# Patient Record
Sex: Female | Born: 1955 | Race: White | Hispanic: No | Smoking: Current every day smoker
Health system: Southern US, Community
[De-identification: ages and names within clinical notes are randomized; demographics above are authoritative.]

## PROBLEM LIST (undated history)

## (undated) DIAGNOSIS — J449 Chronic obstructive pulmonary disease, unspecified: Secondary | ICD-10-CM

## (undated) DIAGNOSIS — F32A Depression, unspecified: Secondary | ICD-10-CM

## (undated) DIAGNOSIS — F329 Major depressive disorder, single episode, unspecified: Secondary | ICD-10-CM

## (undated) DIAGNOSIS — F419 Anxiety disorder, unspecified: Secondary | ICD-10-CM

## (undated) DIAGNOSIS — E876 Hypokalemia: Secondary | ICD-10-CM

## (undated) DIAGNOSIS — I10 Essential (primary) hypertension: Secondary | ICD-10-CM

## (undated) DIAGNOSIS — C801 Malignant (primary) neoplasm, unspecified: Secondary | ICD-10-CM

## (undated) HISTORY — PX: BLADDER SURGERY: SHX569

## (undated) HISTORY — PX: ABDOMINAL HYSTERECTOMY: SHX81

## (undated) HISTORY — PX: CRANIOTOMY: SHX93

## (undated) HISTORY — PX: BREAST LUMPECTOMY: SHX2

---

## 1998-10-10 ENCOUNTER — Other Ambulatory Visit: Admission: RE | Admit: 1998-10-10 | Discharge: 1998-10-10 | Payer: Self-pay | Admitting: Family Medicine

## 2000-05-13 ENCOUNTER — Encounter: Payer: Self-pay | Admitting: Family Medicine

## 2000-05-13 ENCOUNTER — Encounter: Admission: RE | Admit: 2000-05-13 | Discharge: 2000-05-13 | Payer: Self-pay | Admitting: Family Medicine

## 2000-07-18 ENCOUNTER — Other Ambulatory Visit: Admission: RE | Admit: 2000-07-18 | Discharge: 2000-07-18 | Payer: Self-pay | Admitting: Family Medicine

## 2004-03-02 ENCOUNTER — Ambulatory Visit: Payer: Self-pay | Admitting: Oncology

## 2004-03-07 ENCOUNTER — Other Ambulatory Visit: Payer: Self-pay

## 2004-03-27 ENCOUNTER — Ambulatory Visit: Payer: Self-pay | Admitting: Oncology

## 2004-04-26 ENCOUNTER — Ambulatory Visit: Payer: Self-pay | Admitting: Oncology

## 2004-05-27 ENCOUNTER — Ambulatory Visit: Payer: Self-pay | Admitting: Oncology

## 2004-06-27 ENCOUNTER — Ambulatory Visit: Payer: Self-pay | Admitting: Oncology

## 2004-07-25 ENCOUNTER — Ambulatory Visit: Payer: Self-pay | Admitting: Oncology

## 2004-08-25 ENCOUNTER — Ambulatory Visit: Payer: Self-pay | Admitting: Oncology

## 2004-09-24 ENCOUNTER — Ambulatory Visit: Payer: Self-pay | Admitting: Oncology

## 2004-10-10 ENCOUNTER — Encounter: Payer: Self-pay | Admitting: Oncology

## 2004-10-25 ENCOUNTER — Encounter: Payer: Self-pay | Admitting: Oncology

## 2004-10-25 ENCOUNTER — Ambulatory Visit: Payer: Self-pay | Admitting: Oncology

## 2004-11-24 ENCOUNTER — Ambulatory Visit: Payer: Self-pay | Admitting: Oncology

## 2004-11-24 ENCOUNTER — Encounter: Payer: Self-pay | Admitting: Oncology

## 2004-12-25 ENCOUNTER — Encounter: Payer: Self-pay | Admitting: Oncology

## 2005-01-01 ENCOUNTER — Ambulatory Visit: Payer: Self-pay | Admitting: General Surgery

## 2005-01-17 ENCOUNTER — Ambulatory Visit: Payer: Self-pay | Admitting: Radiation Oncology

## 2005-01-25 ENCOUNTER — Encounter: Payer: Self-pay | Admitting: Oncology

## 2005-01-31 ENCOUNTER — Ambulatory Visit: Payer: Self-pay | Admitting: Oncology

## 2005-02-05 ENCOUNTER — Ambulatory Visit: Payer: Self-pay | Admitting: General Surgery

## 2005-02-06 ENCOUNTER — Ambulatory Visit: Payer: Self-pay | Admitting: Family Medicine

## 2005-02-08 ENCOUNTER — Ambulatory Visit: Payer: Self-pay | Admitting: General Surgery

## 2005-02-24 ENCOUNTER — Ambulatory Visit: Payer: Self-pay | Admitting: Oncology

## 2005-06-05 ENCOUNTER — Ambulatory Visit: Payer: Self-pay | Admitting: Oncology

## 2005-06-27 ENCOUNTER — Ambulatory Visit: Payer: Self-pay | Admitting: Oncology

## 2005-07-31 ENCOUNTER — Ambulatory Visit: Payer: Self-pay | Admitting: General Surgery

## 2005-07-31 ENCOUNTER — Ambulatory Visit: Payer: Self-pay | Admitting: Oncology

## 2005-09-02 ENCOUNTER — Ambulatory Visit: Payer: Self-pay | Admitting: Oncology

## 2005-09-24 ENCOUNTER — Ambulatory Visit: Payer: Self-pay | Admitting: Oncology

## 2005-11-22 ENCOUNTER — Emergency Department: Payer: Self-pay | Admitting: Emergency Medicine

## 2006-02-03 ENCOUNTER — Ambulatory Visit: Payer: Self-pay | Admitting: General Surgery

## 2006-02-19 ENCOUNTER — Ambulatory Visit: Payer: Self-pay | Admitting: Oncology

## 2006-02-24 ENCOUNTER — Ambulatory Visit: Payer: Self-pay | Admitting: Oncology

## 2006-03-27 ENCOUNTER — Ambulatory Visit: Payer: Self-pay | Admitting: Oncology

## 2006-05-30 ENCOUNTER — Inpatient Hospital Stay: Payer: Self-pay | Admitting: Cardiology

## 2006-05-30 ENCOUNTER — Other Ambulatory Visit: Payer: Self-pay

## 2006-06-19 ENCOUNTER — Ambulatory Visit: Payer: Self-pay | Admitting: Oncology

## 2006-06-26 ENCOUNTER — Ambulatory Visit: Payer: Self-pay | Admitting: Gastroenterology

## 2006-06-27 ENCOUNTER — Ambulatory Visit: Payer: Self-pay | Admitting: Oncology

## 2006-07-05 ENCOUNTER — Emergency Department: Payer: Self-pay | Admitting: General Practice

## 2006-08-04 ENCOUNTER — Ambulatory Visit: Payer: Self-pay | Admitting: Oncology

## 2006-08-26 ENCOUNTER — Ambulatory Visit: Payer: Self-pay | Admitting: Oncology

## 2006-09-25 ENCOUNTER — Ambulatory Visit: Payer: Self-pay | Admitting: Internal Medicine

## 2006-10-26 ENCOUNTER — Ambulatory Visit: Payer: Self-pay | Admitting: Oncology

## 2006-10-29 ENCOUNTER — Ambulatory Visit: Payer: Self-pay | Admitting: Oncology

## 2006-11-10 ENCOUNTER — Ambulatory Visit: Payer: Self-pay | Admitting: Obstetrics and Gynecology

## 2006-11-17 ENCOUNTER — Emergency Department: Payer: Self-pay | Admitting: General Practice

## 2006-11-25 ENCOUNTER — Ambulatory Visit: Payer: Self-pay | Admitting: Oncology

## 2007-01-26 ENCOUNTER — Ambulatory Visit: Payer: Self-pay | Admitting: Radiation Oncology

## 2007-02-04 ENCOUNTER — Ambulatory Visit: Payer: Self-pay | Admitting: Oncology

## 2007-02-13 ENCOUNTER — Ambulatory Visit: Payer: Self-pay | Admitting: Internal Medicine

## 2007-02-25 ENCOUNTER — Ambulatory Visit: Payer: Self-pay | Admitting: Oncology

## 2007-02-25 ENCOUNTER — Ambulatory Visit: Payer: Self-pay | Admitting: Radiation Oncology

## 2007-04-27 ENCOUNTER — Ambulatory Visit: Payer: Self-pay | Admitting: Oncology

## 2007-05-14 ENCOUNTER — Ambulatory Visit: Payer: Self-pay | Admitting: Oncology

## 2007-05-28 ENCOUNTER — Ambulatory Visit: Payer: Self-pay | Admitting: Oncology

## 2007-06-02 ENCOUNTER — Other Ambulatory Visit: Payer: Self-pay

## 2007-06-02 ENCOUNTER — Emergency Department: Payer: Self-pay | Admitting: Emergency Medicine

## 2007-08-19 ENCOUNTER — Ambulatory Visit: Payer: Self-pay | Admitting: Oncology

## 2008-01-08 IMAGING — CR DG TIBIA/FIBULA 2V*L*
1 series · 2 of 2 positions shown · non-contrast
Comparison: none

REASON FOR EXAM: Injury  - mc rm 1
COMMENTS:

PROCEDURE:     DXR - DXR TIBIA AND FIBULA LT (LOWER L  - November 22, 2005  [DATE]
RESULT:     AP and lateral views of the tibia and fibula reveal the bones to
be adequately mineralized for age.  I do not see evidence of an acute
fracture.  The overlying soft tissues are grossly normal.

[Series 1: view not recorded · 0.17mm/px · 2 of 2 slices shown]
[im 1/2]
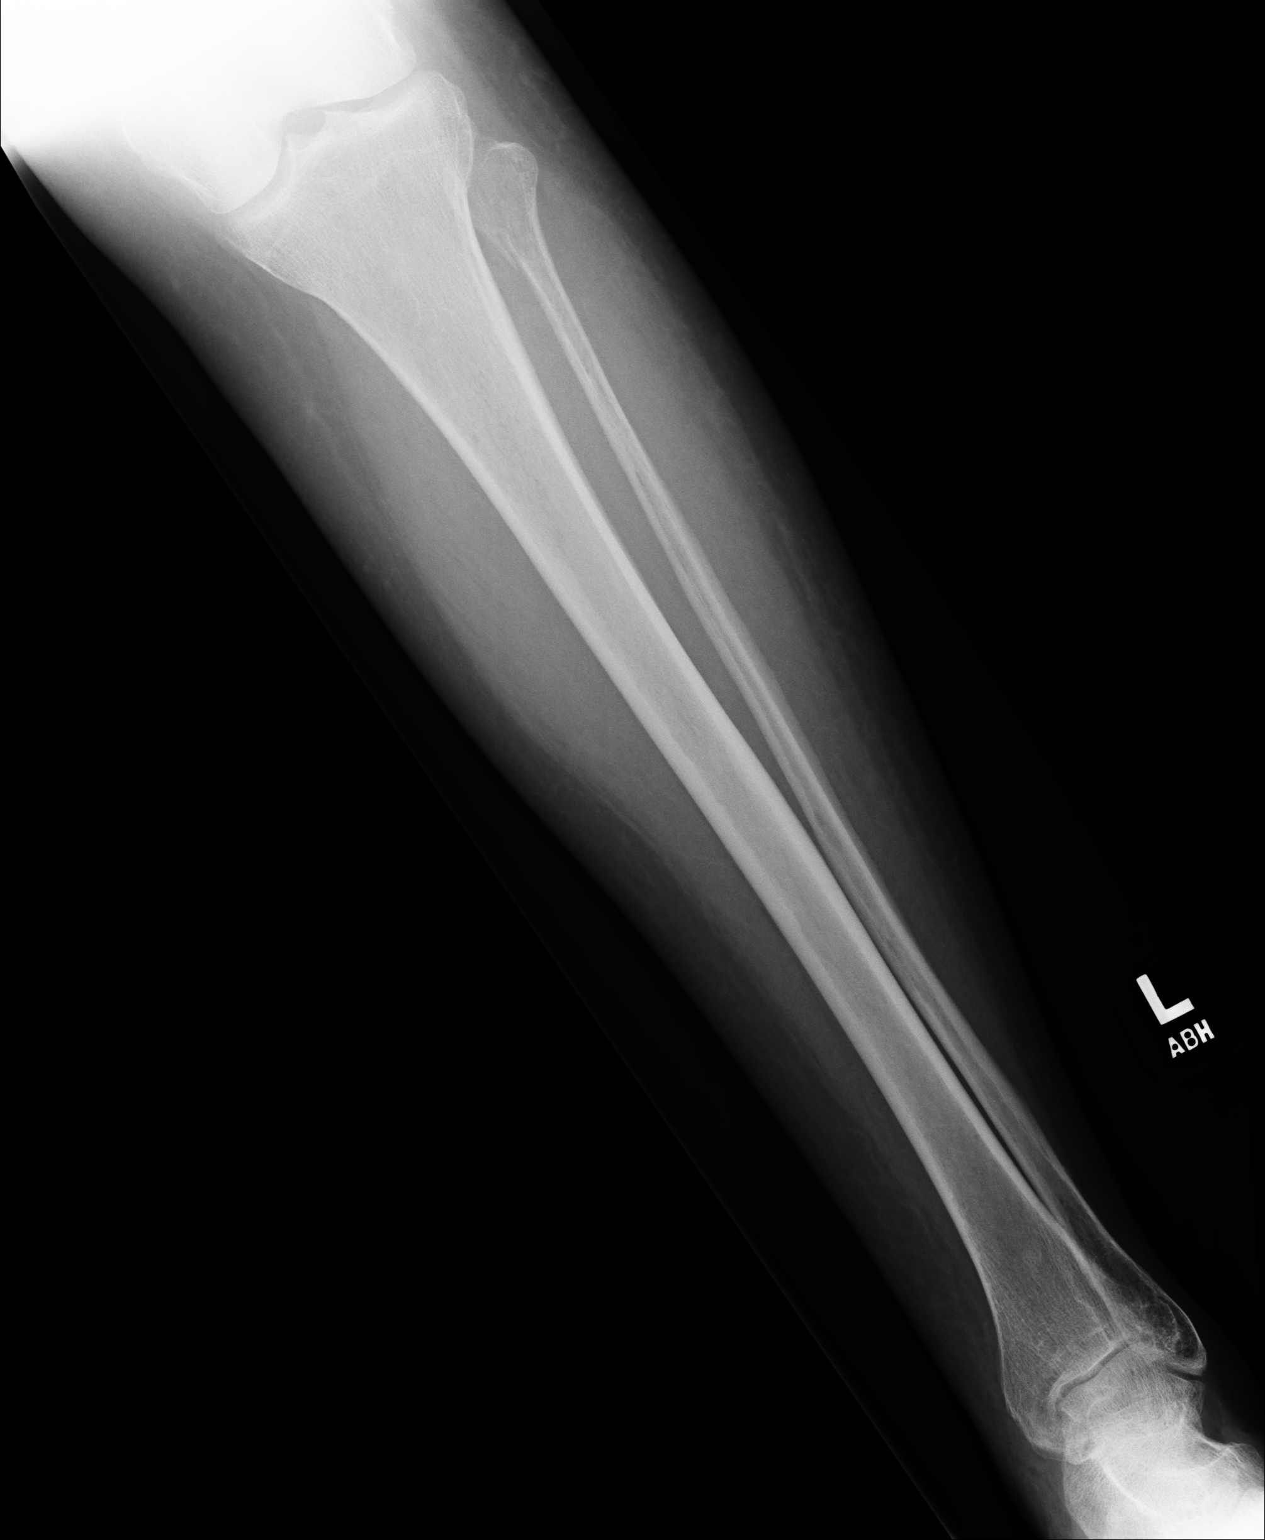
[im 2/2]
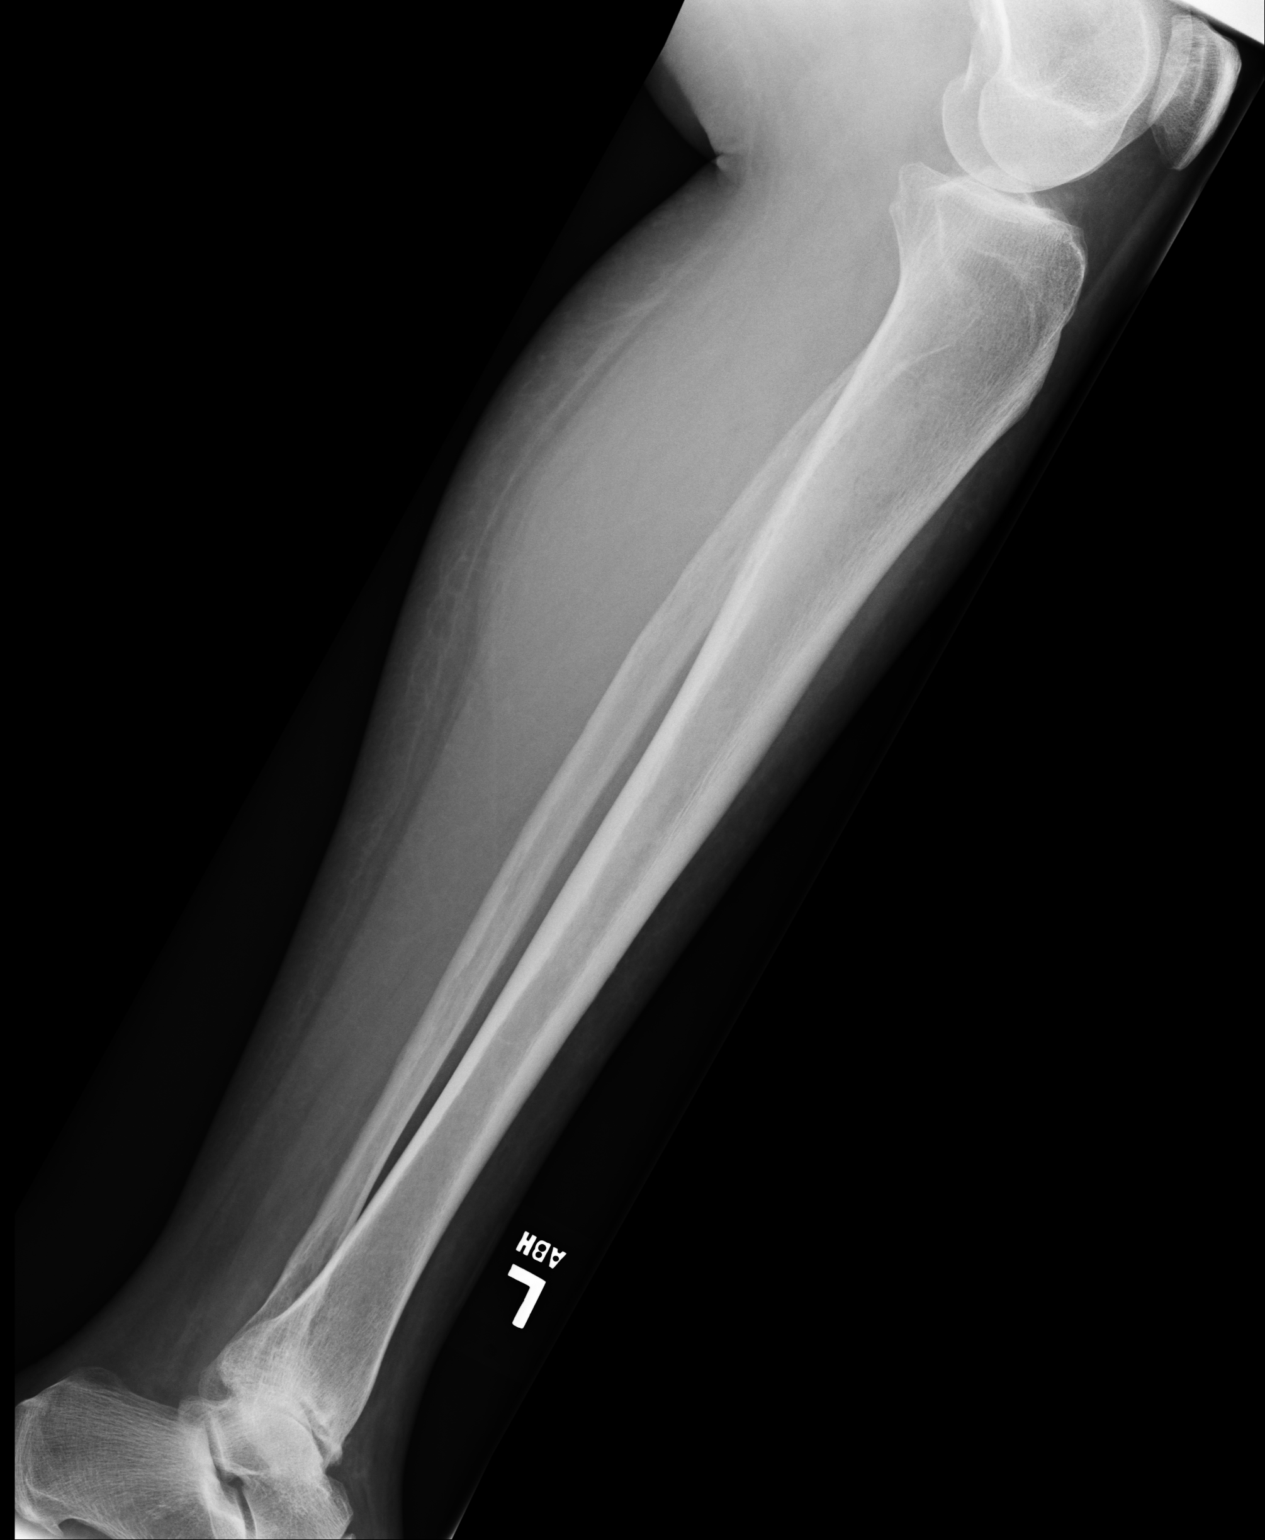

[2 of 2 positions shown; findings below may reference images not displayed]

IMPRESSION: I see no acute abnormality of the LEFT tibia or fibula.

## 2008-01-26 ENCOUNTER — Ambulatory Visit: Payer: Self-pay | Admitting: Oncology

## 2008-12-14 IMAGING — US US EXTREM LOW VENOUS BILAT
1 series · 17 of 24 positions shown · non-contrast
Comparison: none

REASON FOR EXAM: Bilateral pain and swelling
                       Call report to: [phone]
COMMENTS:

[Series 1: us extrem low venous bilat · 17 of 52 slices shown]
[im 1/52]
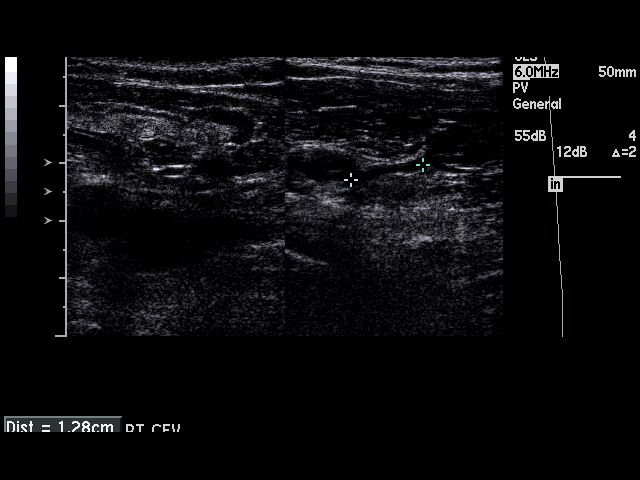
[im 5/52]
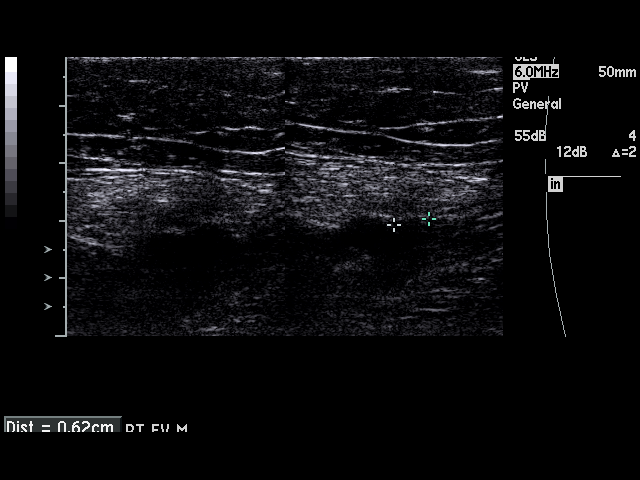
[im 7/52]
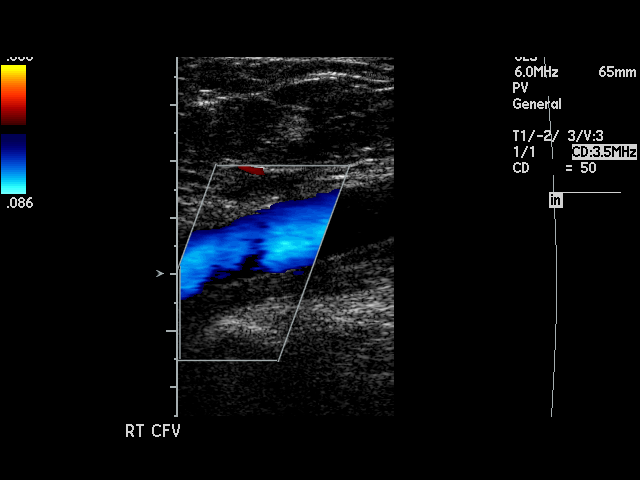
[im 9/52]
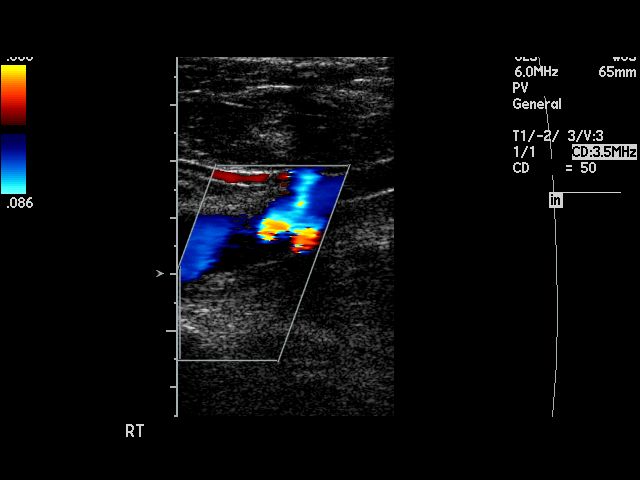
[im 14/52]
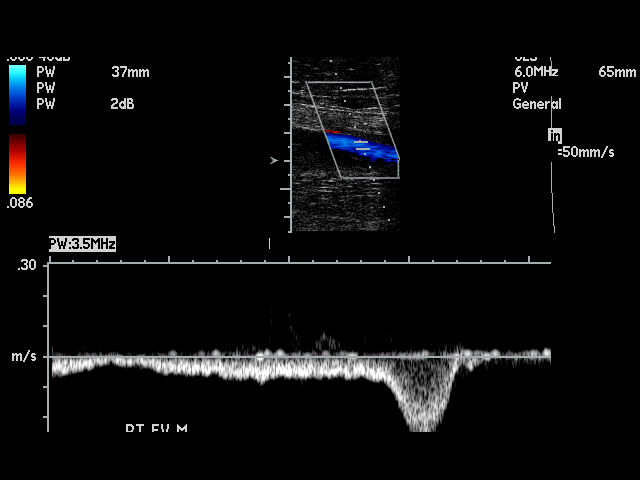
[im 16/52]
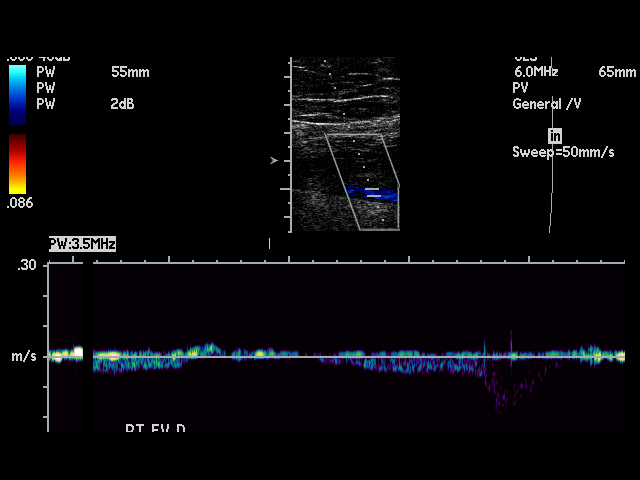
[im 20/52]
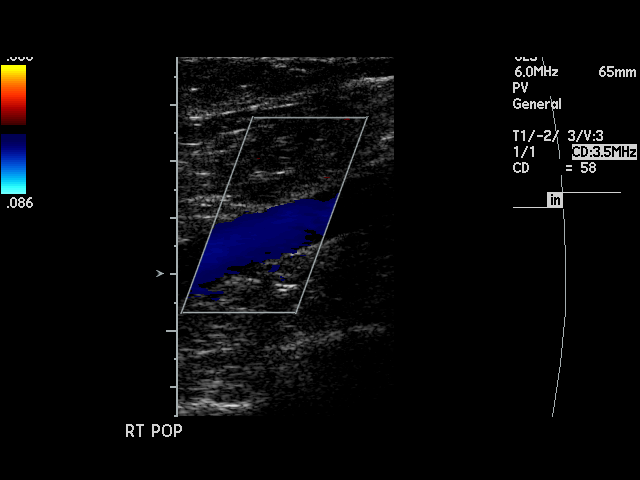
[im 23/52]
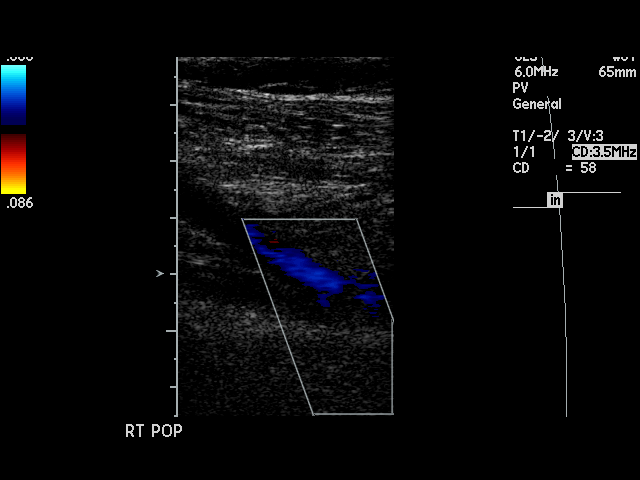
[im 27/52]
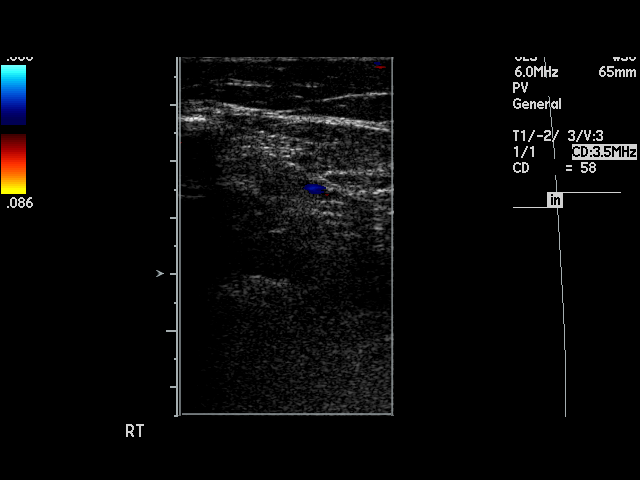
[im 29/52]
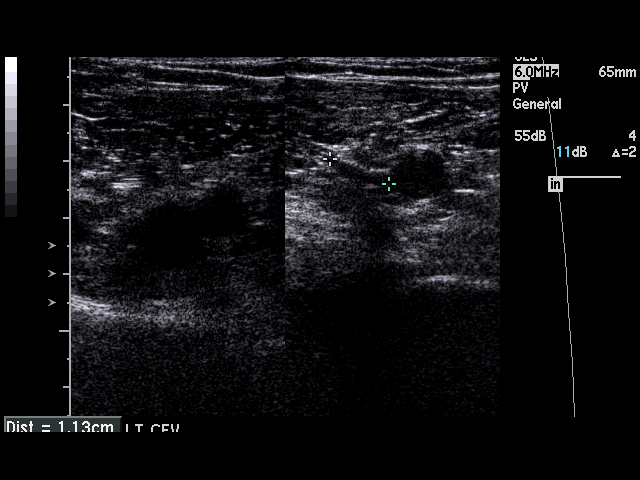
[im 32/52]
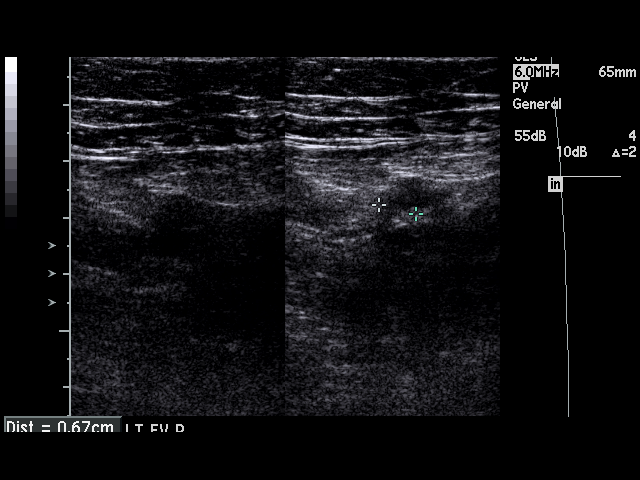
[im 36/52]
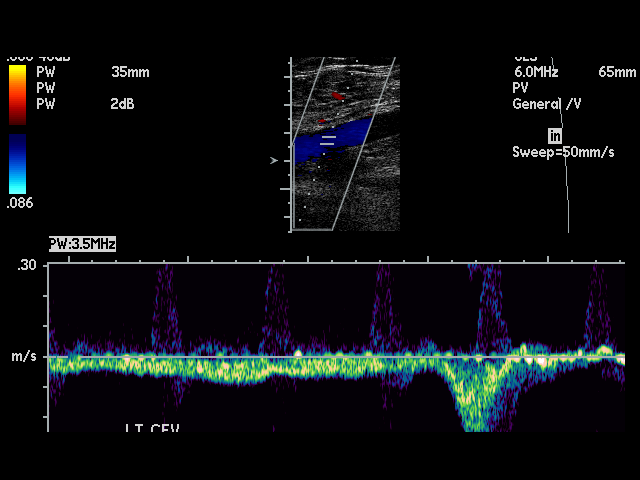
[im 38/52]
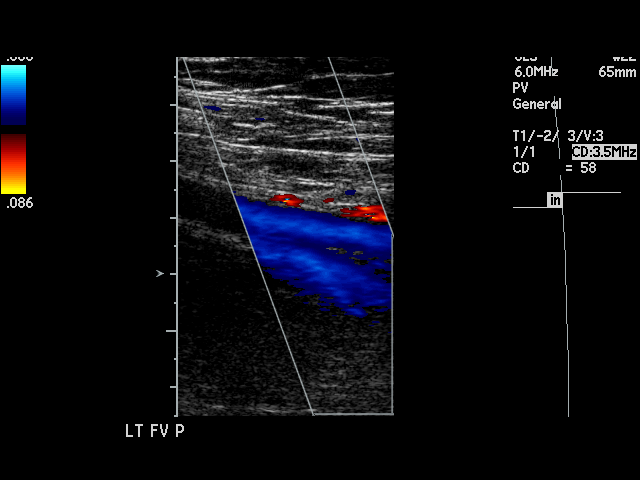
[im 43/52]
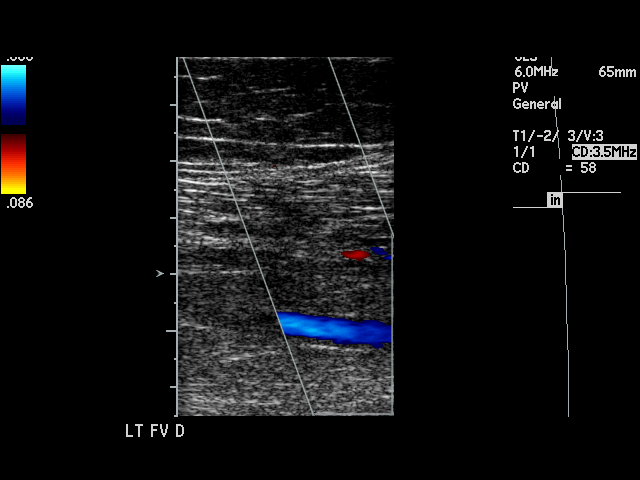
[im 45/52]
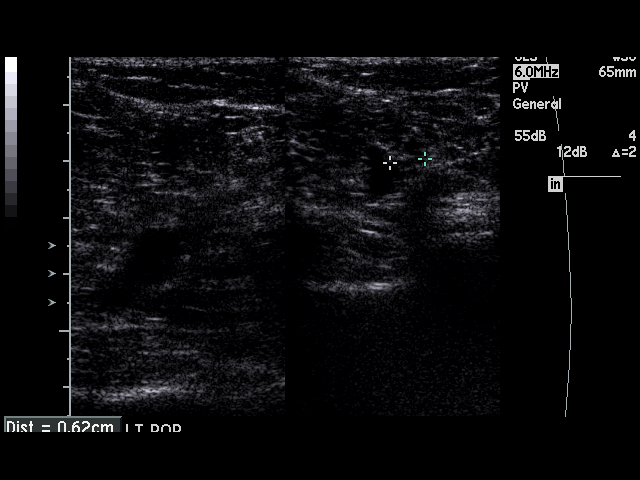
[im 47/52]
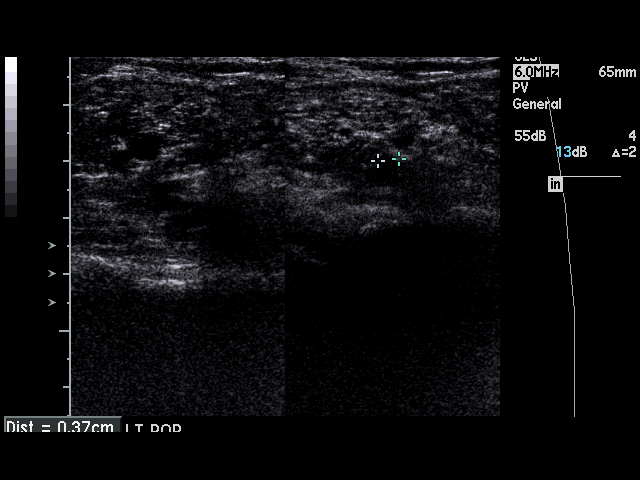
[im 52/52]
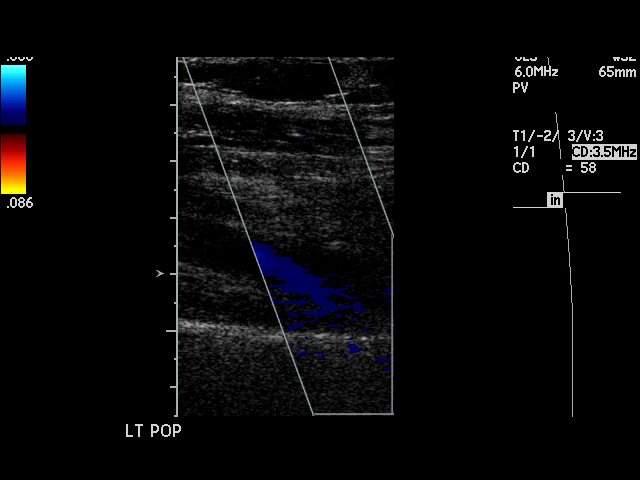

[17 of 24 positions shown; findings below may reference images not displayed]

PROCEDURE:     US  - US DOPPLER LOW EXTR BILATERAL  - October 29, 2006  [DATE]

RESULT:     The phasic and augmentation flow waveforms are normal
bilaterally. There is observed normal compressibility of the femoral and
popliteal veins bilaterally. Doppler examination shows no deep vein
thrombosis or venous occlusion on either side.
IMPRESSION: Bilaterally normal study. No deep venous thrombosis is
identified on either side.

## 2009-06-29 IMAGING — CR DG CHEST 2V
1 series · 2 of 2 positions shown · non-contrast
Comparison: none

REASON FOR EXAM: Cough
COMMENTS:

[Series 1: view not recorded · 0.17mm/px · 2 of 2 slices shown]
[im 1/2]
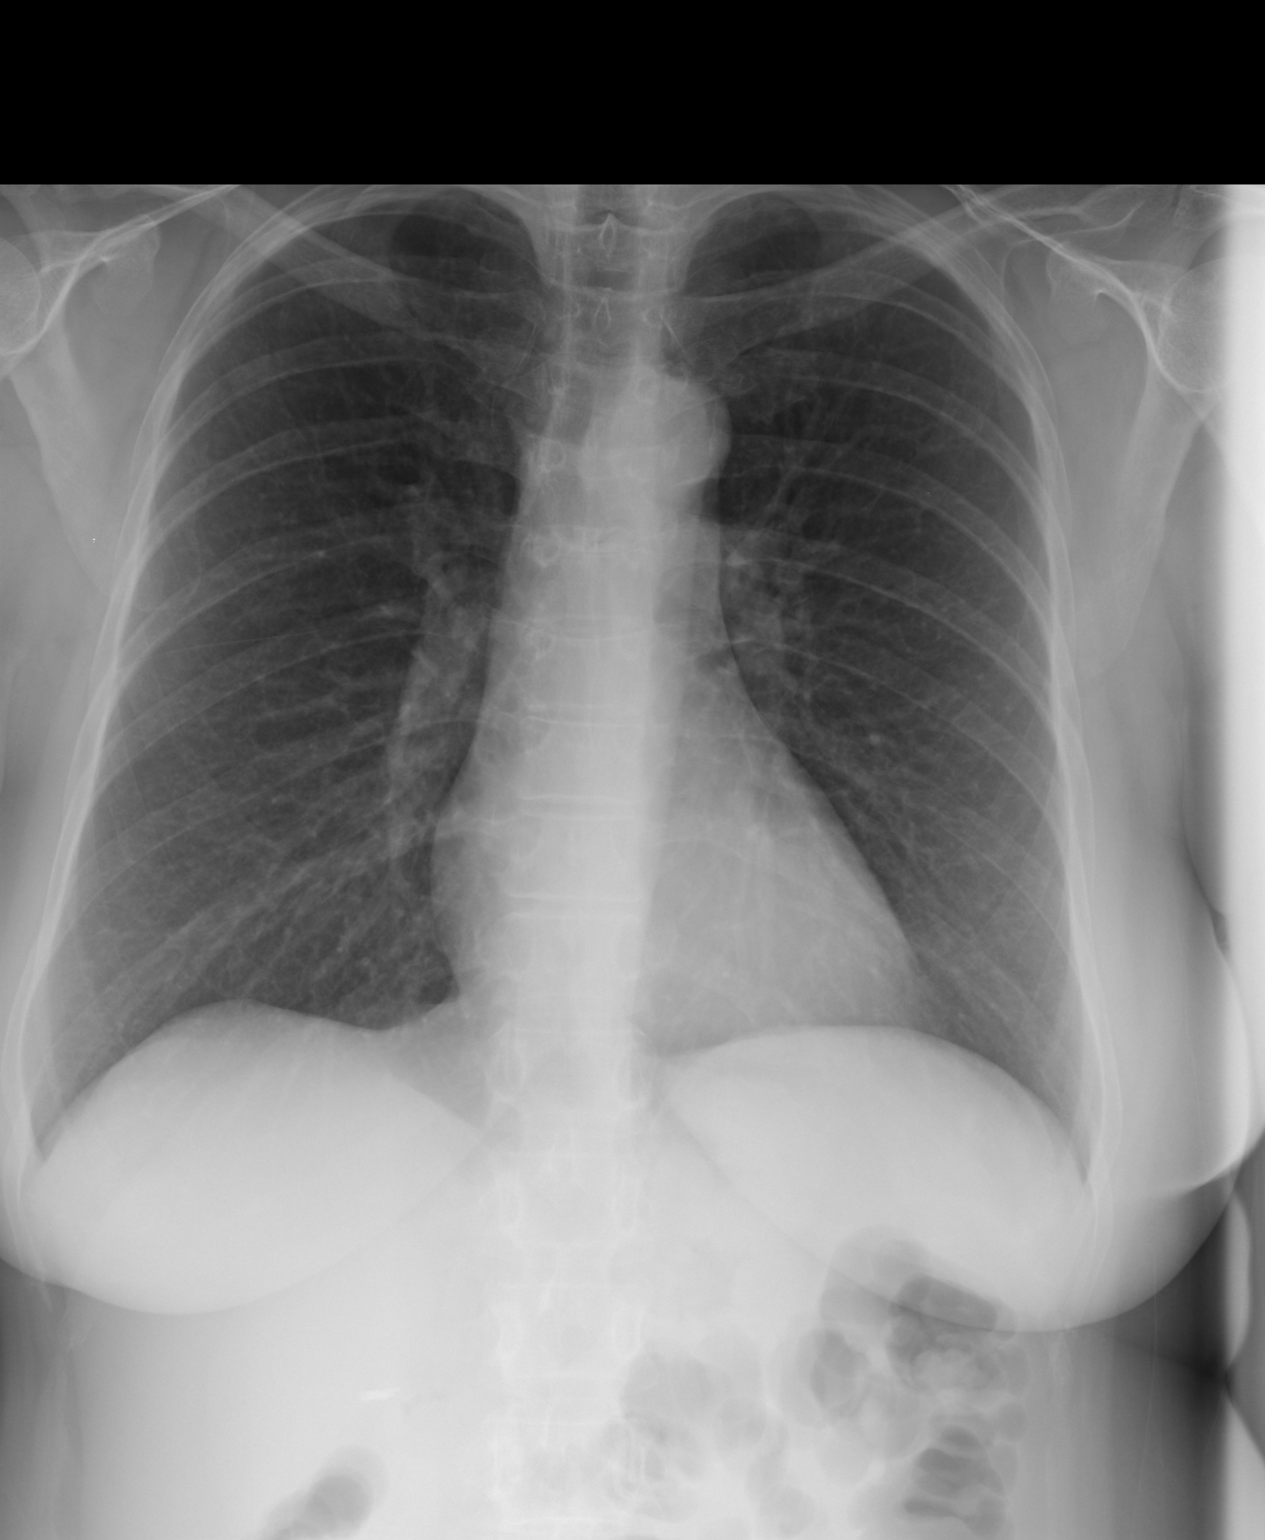
[im 2/2]
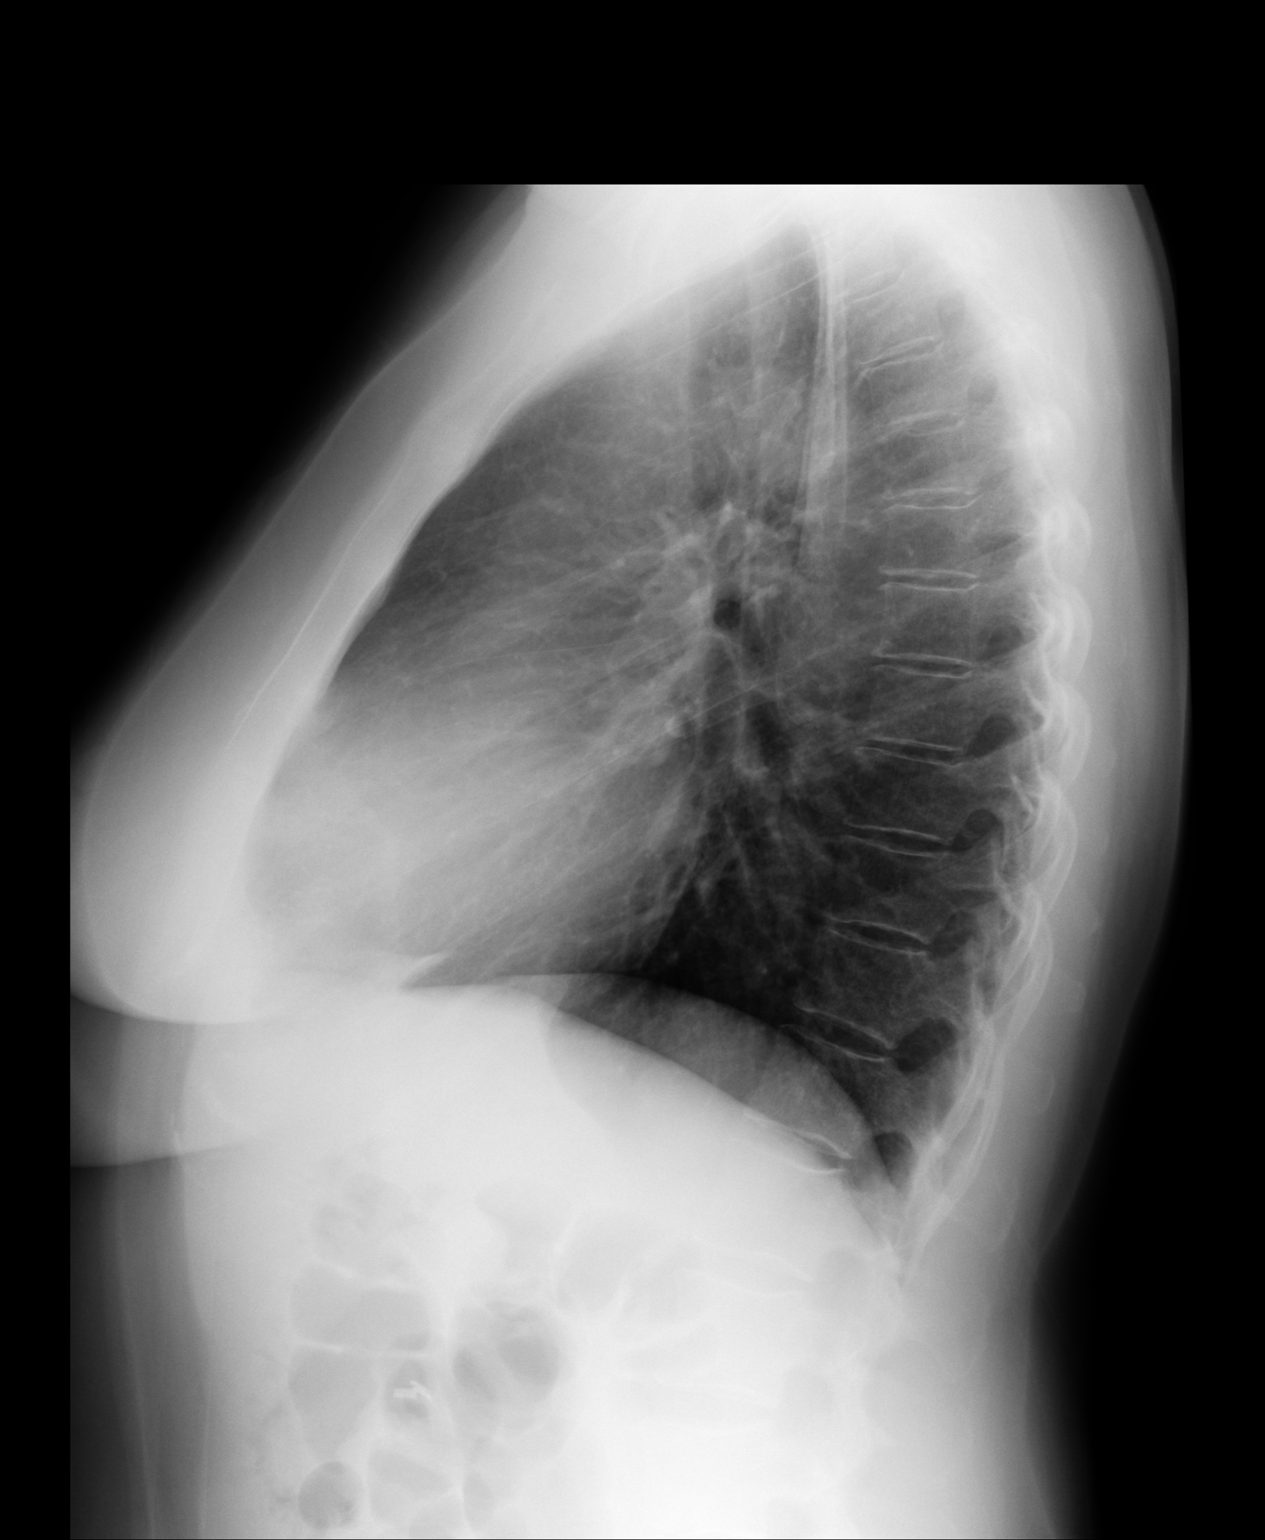

[2 of 2 positions shown; findings below may reference images not displayed]

PROCEDURE:     DXR - DXR CHEST PA (OR AP) AND LATERAL  - May 14, 2007  [DATE]

RESULT:     Comparison is made to a prior exam of 11/17/2006.

The lung fields are clear. No pneumonia, pneumothorax or pleural effusion is
seen. No findings suspicious for metastatic disease are identified. The
heart size is normal. No significant osseous abnormalities are noted.
IMPRESSION: No acute changes are identified.

## 2014-10-07 ENCOUNTER — Telehealth: Payer: Self-pay

## 2014-10-10 NOTE — Telephone Encounter (Signed)
I left an additional message at [phone] for patient to complete follow up for patient enrollment in NSABP B-36 research study. I contacted [facility] in [place] as patient noted her primary care doctor was Dr. Hortencia Pilar.  Patient was seen at primary care office on Sep 26, 2014.  [facility] to fax me office note.  Once note received, I will use this documentation to confirm survival status of patient.

## 2016-05-29 ENCOUNTER — Encounter: Payer: Self-pay | Admitting: Emergency Medicine

## 2016-05-29 ENCOUNTER — Emergency Department: Payer: Medicare PPO

## 2016-05-29 ENCOUNTER — Emergency Department
Admission: EM | Admit: 2016-05-29 | Discharge: 2016-05-30 | Disposition: A | Discharge status: Home / Self Care | Payer: Medicare PPO | Attending: Emergency Medicine | Admitting: Emergency Medicine

## 2016-05-29 DIAGNOSIS — I10 Essential (primary) hypertension: Secondary | ICD-10-CM | POA: Diagnosis not present

## 2016-05-29 DIAGNOSIS — Z79899 Other long term (current) drug therapy: Secondary | ICD-10-CM | POA: Insufficient documentation

## 2016-05-29 DIAGNOSIS — Z853 Personal history of malignant neoplasm of breast: Secondary | ICD-10-CM | POA: Diagnosis not present

## 2016-05-29 DIAGNOSIS — Z87891 Personal history of nicotine dependence: Secondary | ICD-10-CM | POA: Insufficient documentation

## 2016-05-29 DIAGNOSIS — R52 Pain, unspecified: Secondary | ICD-10-CM | POA: Diagnosis present

## 2016-05-29 DIAGNOSIS — J441 Chronic obstructive pulmonary disease with (acute) exacerbation: Secondary | ICD-10-CM | POA: Diagnosis not present

## 2016-05-29 DIAGNOSIS — Z7982 Long term (current) use of aspirin: Secondary | ICD-10-CM | POA: Insufficient documentation

## 2016-05-29 HISTORY — DX: Malignant (primary) neoplasm, unspecified: C80.1

## 2016-05-29 HISTORY — DX: Anxiety disorder, unspecified: F41.9

## 2016-05-29 HISTORY — DX: Essential (primary) hypertension: I10

## 2016-05-29 HISTORY — DX: Chronic obstructive pulmonary disease, unspecified: J44.9

## 2016-05-29 HISTORY — DX: Hypokalemia: E87.6

## 2016-05-29 HISTORY — DX: Depression, unspecified: F32.A

## 2016-05-29 HISTORY — DX: Major depressive disorder, single episode, unspecified: F32.9

## 2016-05-29 LAB — BASIC METABOLIC PANEL
Anion gap: 7 (ref 5–15)
BUN: 16 mg/dL (ref 6–20)
CHLORIDE: 104 mmol/L (ref 101–111)
CO2: 28 mmol/L (ref 22–32)
Calcium: 7.9 mg/dL — ABNORMAL LOW (ref 8.9–10.3)
Creatinine, Ser: 0.89 mg/dL (ref 0.44–1.00)
GFR calc non Af Amer: >60 mL/min (ref >60)
Glucose, Bld: 97 mg/dL (ref 65–99)
POTASSIUM: 3.7 mmol/L (ref 3.5–5.1)
Sodium: 139 mmol/L (ref 135–145)

## 2016-05-29 LAB — TROPONIN I: Troponin I: <0.03 ng/mL (ref <0.03)

## 2016-05-29 LAB — CBC
HEMATOCRIT: 37.5 % (ref 35.0–47.0)
HEMOGLOBIN: 12.8 g/dL (ref 12.0–16.0)
MCH: 30.4 pg (ref 26.0–34.0)
MCHC: 34.1 g/dL (ref 32.0–36.0)
MCV: 89.4 fL (ref 80.0–100.0)
Platelets: 169 10*3/uL (ref 150–440)
RBC: 4.2 MIL/uL (ref 3.80–5.20)
RDW: 14.5 % (ref 11.5–14.5)
WBC: 9.8 10*3/uL (ref 3.6–11.0)

## 2016-05-29 LAB — MAGNESIUM: Magnesium: 1 mg/dL — ABNORMAL LOW (ref 1.7–2.4)

## 2016-05-29 MED ORDER — LEVALBUTEROL HCL 1.25 MG/0.5ML IN NEBU
1.2500 mg | INHALATION_SOLUTION | Freq: Once | RESPIRATORY_TRACT | Status: AC
  Administered 2016-05-30: 1.25 mg via RESPIRATORY_TRACT
  Filled 2016-05-29: qty 0.5

## 2016-05-29 MED ORDER — MAGNESIUM SULFATE 2 GM/50ML IV SOLN
2.0000 g | Freq: Once | INTRAVENOUS | Status: AC
  Administered 2016-05-29: 2 g via INTRAVENOUS
  Filled 2016-05-29: qty 50

## 2016-05-29 MED ORDER — METHYLPREDNISOLONE SODIUM SUCC 125 MG IJ SOLR
125.0000 mg | Freq: Once | INTRAMUSCULAR | Status: AC
  Administered 2016-05-29: 125 mg via INTRAVENOUS
  Filled 2016-05-29: qty 2

## 2016-05-29 NOTE — ED Triage Notes (Signed)
Pt to triage via [place] in [place], report generalized body aches, SOB, seen by PCP yesterday and told magnesium and potassium were abnormal.  Pt NAD at this time, resp equal and unlabored, skin warm and dry.

## 2016-05-29 NOTE — ED Provider Notes (Signed)
[facility] Emergency Department Provider Note   ____________________________________________   First MD Initiated Contact with Patient 05/29/16 2323     (approximate)  I have reviewed the triage vital signs and the nursing notes.   HISTORY  Chief Complaint Generalized Body Aches; Shortness of Breath; and Abnormal Lab  HPI Jill Harrison is a 61 y.o. female who comes into the hospital today with some body aches. The patient reports that she woke up on Sunday and thought that she might have the flu. The patient reports that she went to see her doctor and she was told that her flu test was negative. The patient reports that her symptoms seem to be worsening. She reports that she's burning in her hands as well as having some pain in her legs or arms her neck her upper chest and her back. She reports that she hasn't taken anything at home for pain but rates her pain a 7 out of 10 in intensity. She reports that she came in just because everything was getting worse. The patient has had problems with low potassium in the past and was told her magnesium was low 2 weeks ago. The patient was unsure if that had anything to do with the symptoms. The patient states that she just didn't feel right so she decided to come in to get checked out. The patient has had some generalized weakness and has been staying in bed a lot. The patient did have some fevers on Sunday and Monday she reports with a MAXIMUM TEMPERATURE of 101. The patient has had some cough with shortness of breath and sore throat. The patient is here today for evaluation.   Past Medical History:  Diagnosis Date  . Anxiety   . Cancer [facility])    left breast cancer  . COPD (chronic obstructive pulmonary disease) ([place])   . Depression   . Hypertension   . Hypokalemia     There are no active problems to display for this patient.   Past Surgical History:  Procedure Laterality Date  . ABDOMINAL HYSTERECTOMY      . BLADDER SURGERY    . BREAST LUMPECTOMY    . CRANIOTOMY      Prior to Admission medications   Medication Sig Start Date End Date Taking? Authorizing Provider  ADVAIR DISKUS 500-50 MCG/DOSE AEPB Inhale 1 puff into the lungs 2 (two) times daily.   Yes Historical Provider, MD  ALPRAZolam Duanne Moron) 0.5 MG tablet Take 0.5 mg by mouth 2 (two) times daily as needed for anxiety.   Yes Historical Provider, MD  aspirin EC 81 MG tablet Take 81 mg by mouth daily.   Yes Historical Provider, MD  atorvastatin (LIPITOR) 20 MG tablet Take 20 mg by mouth every evening.   Yes Historical Provider, MD  benazepril (LOTENSIN) 40 MG tablet Take 40 mg by mouth daily.   Yes Historical Provider, MD  FLUoxetine (PROZAC) 40 MG capsule Take 40 mg by mouth daily.   Yes Historical Provider, MD  hydrochlorothiazide (HYDRODIURIL) 12.5 MG tablet Take 12.5 mg by mouth daily.   Yes Historical Provider, MD  letrozole (FEMARA) 2.5 MG tablet Take 2.5 mg by mouth daily.   Yes Historical Provider, MD  levalbuterol [facility] HFA) 45 MCG/ACT inhaler Inhale 2 puffs into the lungs every 4 (four) hours as needed for wheezing.   Yes Historical Provider, MD  pantoprazole (PROTONIX) 40 MG tablet Take 40 mg by mouth 2 (two) times daily.    Yes Historical Provider,  MD  potassium chloride SA (K-DUR,KLOR-CON) 20 MEQ tablet Take 40 mEq by mouth daily.   Yes Historical Provider, MD  predniSONE (DELTASONE) 10 MG tablet Take 10 mg by mouth as directed. 3 tabs 2x daily days 1, 2, 3; 2 tabs 2x daily days 4, 5, 6; 1 tab 2x daily days 7, 8, 9; half tab 2x daily days 10, 11 and 12 05/17/16  Yes Historical Provider, MD  spironolactone (ALDACTONE) 25 MG tablet Take 25 mg by mouth daily.   Yes Historical Provider, MD  tiotropium (SPIRIVA) 18 MCG inhalation capsule Place 18 mcg into inhaler and inhale daily.   Yes Historical Provider, MD  etodolac (LODINE) 200 MG capsule Take 1 capsule (200 mg total) by mouth every 8 (eight) hours. 05/30/16   Loney Hering,  MD  predniSONE (DELTASONE) 20 MG tablet Take 3 tablets (60 mg total) by mouth daily. 05/30/16   Loney Hering, MD    Allergies Albuterol and Avelox [moxifloxacin hcl in nacl]  History reviewed. No pertinent family history.  Social History Social History  Substance Use Topics  . Smoking status: Former Research scientist (life sciences)  . Smokeless tobacco: Never Used  . Alcohol use No    Review of Systems Constitutional:  fever/chills Eyes: No visual changes. ENT: No sore throat. Cardiovascular: Denies chest pain. Respiratory: cough and shortness of breath. Gastrointestinal: No abdominal pain.  No nausea, no vomiting.  No diarrhea.  No constipation. Genitourinary: Negative for dysuria. Musculoskeletal: body aches Skin: Negative for rash. Neurological: Generalized weakness  10-point ROS otherwise negative.  ____________________________________________   PHYSICAL EXAM:  VITAL SIGNS: ED Triage Vitals  Enc Vitals Group     BP 05/29/16 2017 (!) 146/104     Pulse Rate 05/29/16 2017 87     Resp 05/29/16 2017 20     Temp 05/29/16 2017 98.2 F (36.8 C)     Temp Source 05/29/16 2017 Oral     SpO2 05/29/16 2017 96 %     Weight 05/29/16 2018 180 lb (81.6 kg)     Height 05/29/16 2018 5\' 7"  (1.702 m)     Head Circumference --      Peak Flow --      Pain Score 05/29/16 2018 7     Pain Loc --      Pain Edu? --      Excl. in [place]? --     Constitutional: Alert and oriented. Well appearing and in moderate distress. Eyes: Conjunctivae are normal. PERRL. EOMI. Head: Atraumatic. Nose: No congestion/rhinnorhea. Mouth/Throat: Mucous membranes are moist.  Oropharynx non-erythematous. Cardiovascular: Normal rate, regular rhythm. Grossly normal heart sounds.  Good peripheral circulation. Respiratory: Normal respiratory effort.  No retractions. Wheezing in all lung fields Gastrointestinal: Soft and nontender. No distention. Positive bowel sounds Musculoskeletal: No lower extremity tenderness nor edema.   Neurologic:  Normal speech and language.  Skin:  Skin is warm, dry and intact.  Psychiatric: Mood and affect are normal.   ____________________________________________   LABS (all labs ordered are listed, but only abnormal results are displayed)  Labs Reviewed  BASIC METABOLIC PANEL - Abnormal; Notable for the following:       Result Value   Calcium 7.9 (*)    All other components within normal limits  MAGNESIUM - Abnormal; Notable for the following:    Magnesium 1.0 (*)    All other components within normal limits  RAPID INFLUENZA A&B ANTIGENS ([facility] ONLY)  CBC  TROPONIN I   ____________________________________________  EKG  ED ECG  REPORT I, Loney Hering, the attending physician, personally viewed and interpreted this ECG.   Date: 05/29/2016  EKG Time: 2025  Rate: 93  Rhythm: normal sinus rhythm  Axis: normal  Intervals:none  ST&T Change: none  ____________________________________________  RADIOLOGY  CXR ____________________________________________   PROCEDURES  Procedure(s) performed: None  Procedures  Critical Care performed: No  ____________________________________________   INITIAL IMPRESSION / ASSESSMENT AND PLAN / ED COURSE  Pertinent labs & imaging results that were available during my care of the patient were reviewed by me and considered in my medical decision making (see chart for details).  This is a 61 year old female who comes into the hospital today with some body aches and relies weakness and shortness of breath. The patient had a flu test done at her doctor's office but I will repeat flu test here. The patient does have a low magnesium of 1 I will give her a dose of magnesium sulfate. The patient is also wheezing which is concerning for COPD exacerbation so I will give the patient a xopenex as well as some Solu-Medrol. I will reassess the patient when she's received her medications.  Clinical Course as of May 30 236  Wed May 29, 2016  2353 No active cardiopulmonary disease. DG Chest 2 View [AW]    Clinical Course User Index [AW] Loney Hering, MD    The patient's blood work is unremarkable. After giving the patient some magnesium her symptoms had some improvement. The patient also reports that with a Xopenex and the prednisone her shortness of breath improved. I will discharge the patient home to have her follow back up with her primary care physician. Otherwise the patient has no further concerns. She should return with any other questions or concerns. ____________________________________________   FINAL CLINICAL IMPRESSION(S) / ED DIAGNOSES  Final diagnoses:  Body aches  Hypomagnesemia  COPD exacerbation (HCC)      NEW MEDICATIONS STARTED DURING THIS VISIT:  New Prescriptions   ETODOLAC (LODINE) 200 MG CAPSULE    Take 1 capsule (200 mg total) by mouth every 8 (eight) hours.   PREDNISONE (DELTASONE) 20 MG TABLET    Take 3 tablets (60 mg total) by mouth daily.     Note:  This document was prepared using Dragon voice recognition software and may include unintentional dictation errors.    Loney Hering, MD 05/30/16 [phone]

## 2016-05-30 LAB — RAPID INFLUENZA A&B ANTIGENS (ARMC ONLY): INFLUENZA B (ARMC): NEGATIVE

## 2016-05-30 LAB — RAPID INFLUENZA A&B ANTIGENS: Influenza A (ARMC): NEGATIVE

## 2016-05-30 MED ORDER — PREDNISONE 20 MG PO TABS: 60.0000 mg | ORAL_TABLET | Freq: Every day | ORAL | 0 refills | Status: AC

## 2016-05-30 MED ORDER — LEVALBUTEROL HCL 1.25 MG/0.5ML IN NEBU
1.2500 mg | INHALATION_SOLUTION | Freq: Once | RESPIRATORY_TRACT | Status: AC
  Administered 2016-05-30: 1.25 mg via RESPIRATORY_TRACT
  Filled 2016-05-30: qty 0.5

## 2016-05-30 MED ORDER — ETODOLAC 200 MG PO CAPS: 200.0000 mg | ORAL_CAPSULE | Freq: Three times a day (TID) | ORAL | 0 refills | Status: AC

## 2016-05-30 NOTE — Discharge Instructions (Signed)
Please continue taking her magnesium and please follow up with her primary care physician. If he have any worsening symptoms please return to the emergency department.

## 2016-06-10 ENCOUNTER — Emergency Department
Admission: EM | Admit: 2016-06-10 | Discharge: 2016-06-10 | Disposition: A | Discharge status: Home / Self Care | Payer: Medicare PPO | Attending: Emergency Medicine | Admitting: Emergency Medicine

## 2016-06-10 ENCOUNTER — Encounter: Payer: Self-pay | Admitting: Emergency Medicine

## 2016-06-10 ENCOUNTER — Emergency Department: Payer: Medicare PPO

## 2016-06-10 DIAGNOSIS — J449 Chronic obstructive pulmonary disease, unspecified: Secondary | ICD-10-CM | POA: Diagnosis not present

## 2016-06-10 DIAGNOSIS — Z87891 Personal history of nicotine dependence: Secondary | ICD-10-CM | POA: Insufficient documentation

## 2016-06-10 DIAGNOSIS — R202 Paresthesia of skin: Secondary | ICD-10-CM

## 2016-06-10 DIAGNOSIS — Z853 Personal history of malignant neoplasm of breast: Secondary | ICD-10-CM | POA: Insufficient documentation

## 2016-06-10 DIAGNOSIS — I10 Essential (primary) hypertension: Secondary | ICD-10-CM | POA: Diagnosis not present

## 2016-06-10 DIAGNOSIS — Z7982 Long term (current) use of aspirin: Secondary | ICD-10-CM | POA: Diagnosis not present

## 2016-06-10 DIAGNOSIS — R079 Chest pain, unspecified: Secondary | ICD-10-CM | POA: Insufficient documentation

## 2016-06-10 DIAGNOSIS — Z79899 Other long term (current) drug therapy: Secondary | ICD-10-CM | POA: Insufficient documentation

## 2016-06-10 LAB — TROPONIN I
Troponin I: <0.03 ng/mL (ref <0.03)
Troponin I: <0.03 ng/mL (ref <0.03)

## 2016-06-10 LAB — CBC
HCT: 37.5 % (ref 35.0–47.0)
Hemoglobin: 12.8 g/dL (ref 12.0–16.0)
MCH: 30.2 pg (ref 26.0–34.0)
MCHC: 34.1 g/dL (ref 32.0–36.0)
MCV: 88.7 fL (ref 80.0–100.0)
PLATELETS: 172 10*3/uL (ref 150–440)
RBC: 4.23 MIL/uL (ref 3.80–5.20)
RDW: 15 % — AB (ref 11.5–14.5)
WBC: 7.1 10*3/uL (ref 3.6–11.0)

## 2016-06-10 LAB — BASIC METABOLIC PANEL
Anion gap: 5 (ref 5–15)
BUN: 8 mg/dL (ref 6–20)
CALCIUM: 9.1 mg/dL (ref 8.9–10.3)
CO2: 27 mmol/L (ref 22–32)
Chloride: 106 mmol/L (ref 101–111)
Creatinine, Ser: 0.78 mg/dL (ref 0.44–1.00)
GFR calc Af Amer: >60 mL/min (ref >60)
Glucose, Bld: 116 mg/dL — ABNORMAL HIGH (ref 65–99)
Potassium: 4.2 mmol/L (ref 3.5–5.1)
SODIUM: 138 mmol/L (ref 135–145)

## 2016-06-10 LAB — MAGNESIUM: Magnesium: 1.8 mg/dL (ref 1.7–2.4)

## 2016-06-10 NOTE — ED Triage Notes (Signed)
Pt reports central chest pain that began today. Pt reports left arm felt numb and tingly. Pt states she took two xanax but it did not relieve the chest pain.

## 2016-06-10 NOTE — Discharge Instructions (Signed)
Please seek medical attention for any high fevers, chest pain, shortness of breath, change in behavior, persistent vomiting, bloody stool or any other new or concerning symptoms.  

## 2016-06-10 NOTE — ED Notes (Signed)
ED Provider at bedside. 

## 2016-06-10 NOTE — ED Provider Notes (Signed)
[facility] Emergency Department Provider Note   ____________________________________________   I have reviewed the triage vital signs and the nursing notes.   HISTORY  Chief Complaint Chest Pain   History limited by: Not Limited   HPI Jill Harrison is a 61 y.o. female who presents to the emergency department today because of concerns for paresthesias as well as weakness. The patient states that she has been having problems with paresthesias for the past 2 weeks. She has been worked up for this with her primary care doctor. She states that she's been found to have a low magnesium. Paresthesias are from her knees down and in her hands. Today however she also had chest pain. It is located centrally. It was not associated with any shortness breath or diaphoresis. Her doctor's office was closed so she chose to come to the emergency department. She denies any similar chest pain in the past.   Past Medical History:  Diagnosis Date  . Anxiety   . Cancer [facility])    left breast cancer  . COPD (chronic obstructive pulmonary disease) ([place])   . Depression   . Hypertension   . Hypokalemia     There are no active problems to display for this patient.   Past Surgical History:  Procedure Laterality Date  . ABDOMINAL HYSTERECTOMY    . BLADDER SURGERY    . BREAST LUMPECTOMY    . CRANIOTOMY      Prior to Admission medications   Medication Sig Start Date End Date Taking? Authorizing Provider  ADVAIR DISKUS 500-50 MCG/DOSE AEPB Inhale 1 puff into the lungs 2 (two) times daily.    Historical Provider, MD  ALPRAZolam Duanne Moron) 0.5 MG tablet Take 0.5 mg by mouth 2 (two) times daily as needed for anxiety.    Historical Provider, MD  aspirin EC 81 MG tablet Take 81 mg by mouth daily.    Historical Provider, MD  atorvastatin (LIPITOR) 20 MG tablet Take 20 mg by mouth every evening.    Historical Provider, MD  benazepril (LOTENSIN) 40 MG tablet Take 40 mg by mouth daily.     Historical Provider, MD  etodolac (LODINE) 200 MG capsule Take 1 capsule (200 mg total) by mouth every 8 (eight) hours. 05/30/16   Loney Hering, MD  FLUoxetine (PROZAC) 40 MG capsule Take 40 mg by mouth daily.    Historical Provider, MD  hydrochlorothiazide (HYDRODIURIL) 12.5 MG tablet Take 12.5 mg by mouth daily.    Historical Provider, MD  letrozole (FEMARA) 2.5 MG tablet Take 2.5 mg by mouth daily.    Historical Provider, MD  levalbuterol [facility] HFA) 45 MCG/ACT inhaler Inhale 2 puffs into the lungs every 4 (four) hours as needed for wheezing.    Historical Provider, MD  pantoprazole (PROTONIX) 40 MG tablet Take 40 mg by mouth 2 (two) times daily.     Historical Provider, MD  potassium chloride SA (K-DUR,KLOR-CON) 20 MEQ tablet Take 40 mEq by mouth daily.    Historical Provider, MD  predniSONE (DELTASONE) 10 MG tablet Take 10 mg by mouth as directed. 3 tabs 2x daily days 1, 2, 3; 2 tabs 2x daily days 4, 5, 6; 1 tab 2x daily days 7, 8, 9; half tab 2x daily days 10, 11 and 12 05/17/16   Historical Provider, MD  predniSONE (DELTASONE) 20 MG tablet Take 3 tablets (60 mg total) by mouth daily. 05/30/16   Loney Hering, MD  spironolactone (ALDACTONE) 25 MG tablet Take 25 mg by  mouth daily.    Historical Provider, MD  tiotropium (SPIRIVA) 18 MCG inhalation capsule Place 18 mcg into inhaler and inhale daily.    Historical Provider, MD    Allergies Albuterol and Avelox [moxifloxacin hcl in nacl]  No family history on file.  Social History Social History  Substance Use Topics  . Smoking status: Former Research scientist (life sciences)  . Smokeless tobacco: Never Used  . Alcohol use No    Review of Systems  Constitutional: Negative for fever. Cardiovascular: Positive for chest pain. Respiratory: Negative for shortness of breath. Gastrointestinal: Negative for abdominal pain, vomiting and diarrhea. Genitourinary: Negative for dysuria. Musculoskeletal: Negative for back pain. Skin: Negative for  rash. Neurological: Positive for numbness and tingling   10-point ROS otherwise negative.  ____________________________________________   PHYSICAL EXAM:  VITAL SIGNS: ED Triage Vitals  Enc Vitals Group     BP 06/10/16 1552 130/85     Pulse Rate 06/10/16 1552 88     Resp 06/10/16 1552 18     Temp 06/10/16 1552 97.9 F (36.6 C)     Temp Source 06/10/16 1552 Oral     SpO2 06/10/16 1552 96 %     Weight 06/10/16 1552 178 lb (80.7 kg)     Height 06/10/16 1552 5\' 7"  (1.702 m)     Head Circumference --      Peak Flow --      Pain Score 06/10/16 1600 5   Constitutional: Alert and oriented. Well appearing and in no distress. Eyes: Conjunctivae are normal. Normal extraocular movements. ENT   Head: Normocephalic and atraumatic.   Nose: No congestion/rhinnorhea.   Mouth/Throat: Mucous membranes are moist.   Neck: No stridor. Hematological/Lymphatic/Immunilogical: No cervical lymphadenopathy. Cardiovascular: Normal rate, regular rhythm.  No murmurs, rubs, or gallops.  Respiratory: Normal respiratory effort without tachypnea nor retractions. Breath sounds are clear and equal bilaterally. No wheezes/rales/rhonchi. Gastrointestinal: Soft and non tender. No rebound. No guarding.  Genitourinary: Deferred Musculoskeletal: Normal range of motion in all extremities. No lower extremity edema. Neurologic:  Normal speech and language. No gross focal neurologic deficits are appreciated.  Skin:  Skin is warm, dry and intact. No rash noted. Psychiatric: Mood and affect are normal. Speech and behavior are normal. Patient exhibits appropriate insight and judgment.  ____________________________________________    LABS (pertinent positives/negatives)  Labs Reviewed  BASIC METABOLIC PANEL - Abnormal; Notable for the following:       Result Value   Glucose, Bld 116 (*)    All other components within normal limits  CBC - Abnormal; Notable for the following:    RDW 15.0 (*)    All  other components within normal limits  TROPONIN I  TROPONIN I  MAGNESIUM     ____________________________________________   EKG  I, Nance Pear, attending physician, personally viewed and interpreted this EKG  EKG Time: 1551 Rate: 79 Rhythm: normal sinus rhythm Axis: normal Intervals: qtc 447 QRS: narrow ST changes: no st elevation Impression: normal ekg  ____________________________________________    RADIOLOGY  CXR IMPRESSION: No active cardiopulmonary disease. No evidence of pneumonia or pulmonary edema.   ____________________________________________   PROCEDURES  Procedures  ____________________________________________   INITIAL IMPRESSION / ASSESSMENT AND PLAN / ED COURSE  Pertinent labs & imaging results that were available during my care of the patient were reviewed by me and considered in my medical decision making (see chart for details).  Patient was admitted to the emergency department today because of concerns for paresthesias as well as chest pain. Two sets  of troponin negative in the emergency department. EKG without concerning findings. Discussed with patient that at this point unlikely ACS or heart related. Did discuss with patient importance of following up with PCP.  ____________________________________________   FINAL CLINICAL IMPRESSION(S) / ED DIAGNOSES  Final diagnoses:  Paresthesia     Note: This dictation was prepared with Dragon dictation. Any transcriptional errors that result from this process are unintentional     Nance Pear, MD 06/10/16 2036

## 2016-08-18 ENCOUNTER — Encounter: Payer: Self-pay | Admitting: Emergency Medicine

## 2016-08-18 ENCOUNTER — Emergency Department
Admission: EM | Admit: 2016-08-18 | Discharge: 2016-08-18 | Disposition: A | Discharge status: Home / Self Care | Payer: Medicare PPO | Attending: Emergency Medicine | Admitting: Emergency Medicine

## 2016-08-18 DIAGNOSIS — J449 Chronic obstructive pulmonary disease, unspecified: Secondary | ICD-10-CM | POA: Diagnosis not present

## 2016-08-18 DIAGNOSIS — I10 Essential (primary) hypertension: Secondary | ICD-10-CM | POA: Diagnosis not present

## 2016-08-18 DIAGNOSIS — Z79899 Other long term (current) drug therapy: Secondary | ICD-10-CM | POA: Insufficient documentation

## 2016-08-18 DIAGNOSIS — Z853 Personal history of malignant neoplasm of breast: Secondary | ICD-10-CM | POA: Diagnosis not present

## 2016-08-18 DIAGNOSIS — Z7982 Long term (current) use of aspirin: Secondary | ICD-10-CM | POA: Insufficient documentation

## 2016-08-18 DIAGNOSIS — J029 Acute pharyngitis, unspecified: Secondary | ICD-10-CM | POA: Insufficient documentation

## 2016-08-18 DIAGNOSIS — F17210 Nicotine dependence, cigarettes, uncomplicated: Secondary | ICD-10-CM | POA: Diagnosis not present

## 2016-08-18 MED ORDER — AMOXICILLIN 500 MG PO TABS: 500.0000 mg | ORAL_TABLET | Freq: Three times a day (TID) | ORAL | 0 refills | Status: AC

## 2016-08-18 MED ORDER — LIDOCAINE VISCOUS 2 % MT SOLN: 20.0000 mL | OROMUCOSAL | 0 refills | Status: AC | PRN

## 2016-08-18 NOTE — ED Triage Notes (Signed)
Pt in via POV with complaints of sore throat and cough since yesterday morning.  Pt denies any other complaints.  Vitals WDL, NAD noted at this time.

## 2016-08-18 NOTE — ED Provider Notes (Signed)
[facility] Emergency Department Provider Note   ____________________________________________   First MD Initiated Contact with Patient 08/18/16 1205     (approximate)  I have reviewed the triage vital signs and the nursing notes.   HISTORY  Chief Complaint Sore Throat    HPI Jill Harrison is a 61 y.o. female who presents for evaluation of sore throat 2 days. Patient states that progressively getting worse THIS morning with extreme difficulty swollen secondary to the pain. She denies any shortness of breath difficulty breathing dyspnea or chest pains. Denies any runny nose or congestion.   Past Medical History:  Diagnosis Date  . Anxiety   . Cancer [facility])    left breast cancer  . COPD (chronic obstructive pulmonary disease) ([place])   . Depression   . Hypertension   . Hypokalemia     There are no active problems to display for this patient.   Past Surgical History:  Procedure Laterality Date  . ABDOMINAL HYSTERECTOMY    . BLADDER SURGERY    . BREAST LUMPECTOMY    . CRANIOTOMY      Prior to Admission medications   Medication Sig Start Date End Date Taking? Authorizing Provider  ADVAIR DISKUS 500-50 MCG/DOSE AEPB Inhale 1 puff into the lungs 2 (two) times daily.    Historical Provider, MD  ALPRAZolam Duanne Moron) 0.5 MG tablet Take 0.5 mg by mouth 2 (two) times daily as needed for anxiety.    Historical Provider, MD  amoxicillin (AMOXIL) 500 MG tablet Take 1 tablet (500 mg total) by mouth 3 (three) times daily. 08/18/16   Rudene Re, MD  aspirin EC 81 MG tablet Take 81 mg by mouth daily.    Historical Provider, MD  atorvastatin (LIPITOR) 20 MG tablet Take 20 mg by mouth every evening.    Historical Provider, MD  benazepril (LOTENSIN) 40 MG tablet Take 40 mg by mouth daily.    Historical Provider, MD  etodolac (LODINE) 200 MG capsule Take 1 capsule (200 mg total) by mouth every 8 (eight) hours. 05/30/16   Loney Hering, MD  FLUoxetine  (PROZAC) 40 MG capsule Take 40 mg by mouth daily.    Historical Provider, MD  hydrochlorothiazide (HYDRODIURIL) 12.5 MG tablet Take 12.5 mg by mouth daily.    Historical Provider, MD  letrozole (FEMARA) 2.5 MG tablet Take 2.5 mg by mouth daily.    Historical Provider, MD  levalbuterol [facility] HFA) 45 MCG/ACT inhaler Inhale 2 puffs into the lungs every 4 (four) hours as needed for wheezing.    Historical Provider, MD  lidocaine (XYLOCAINE) 2 % solution Use as directed 20 mLs in the mouth or throat as needed for mouth pain. 08/18/16   Rudene Re, MD  pantoprazole (PROTONIX) 40 MG tablet Take 40 mg by mouth 2 (two) times daily.     Historical Provider, MD  potassium chloride SA (K-DUR,KLOR-CON) 20 MEQ tablet Take 40 mEq by mouth daily.    Historical Provider, MD  predniSONE (DELTASONE) 10 MG tablet Take 10 mg by mouth as directed. 3 tabs 2x daily days 1, 2, 3; 2 tabs 2x daily days 4, 5, 6; 1 tab 2x daily days 7, 8, 9; half tab 2x daily days 10, 11 and 12 05/17/16   Historical Provider, MD  predniSONE (DELTASONE) 20 MG tablet Take 3 tablets (60 mg total) by mouth daily. 05/30/16   Loney Hering, MD  spironolactone (ALDACTONE) 25 MG tablet Take 25 mg by mouth daily.    Historical  Provider, MD  tiotropium (SPIRIVA) 18 MCG inhalation capsule Place 18 mcg into inhaler and inhale daily.    Historical Provider, MD    Allergies Albuterol and Avelox [moxifloxacin hcl in nacl]  No family history on file.  Social History Social History  Substance Use Topics  . Smoking status: Current Every Day Smoker    Packs/day: 0.50    Types: Cigarettes  . Smokeless tobacco: Never Used  . Alcohol use No    Review of Systems Constitutional: No fever/chills Eyes: No visual changes. ENT: Positive for sore throat. Cardiovascular: Denies chest pain. Respiratory: Denies shortness of breath. Musculoskeletal: Negative for back pain. Skin: Negative for rash. Neurological: Negative for headaches, focal  weakness or numbness.  10-point ROS otherwise negative.  ____________________________________________   PHYSICAL EXAM:  VITAL SIGNS: ED Triage Vitals [08/18/16 1147]  Enc Vitals Group     BP (!) 160/98     Pulse Rate 87     Resp 18     Temp 98.8 F (37.1 C)     Temp Source Oral     SpO2 96 %     Weight 176 lb (79.8 kg)     Height 5\' 7"  (1.702 m)     Head Circumference      Peak Flow      Pain Score 9     Pain Loc      Pain Edu?      Excl. in [place]?     Constitutional: Alert and oriented. Well appearing and in no acute distress. Eyes: Conjunctivae are normal. PERRL. EOMI. Head: Atraumatic. Nose: No congestion/rhinnorhea. Mouth/Throat: Mucous membranes are moist.  Oropharynx non-erythematous. Neck: No stridor.  Supple, full range of motion, nontender. 1-2+ tonsillar edema with no exudate noted. Mild erythema. Cardiovascular: Normal rate, regular rhythm. Grossly normal heart sounds.  Good peripheral circulation. Respiratory: Normal respiratory effort.  No retractions. Lungs CTAB. Musculoskeletal: No lower extremity tenderness nor edema.  No joint effusions. Neurologic:  Normal speech and language. No gross focal neurologic deficits are appreciated. No gait instability. Skin:  Skin is warm, dry and intact. No rash noted. Psychiatric: Mood and affect are normal. Speech and behavior are normal.  ____________________________________________   LABS (all labs ordered are listed, but only abnormal results are displayed)  Labs Reviewed - No data to display ____________________________________________  EKG   ____________________________________________  RADIOLOGY   ____________________________________________   PROCEDURES  Procedure(s) performed: None  Procedures  Critical Care performed: No  ____________________________________________   INITIAL IMPRESSION / ASSESSMENT AND PLAN / ED COURSE  Pertinent labs & imaging results that were available during my care  of the patient were reviewed by me and considered in my medical decision making (see chart for details).  Acute pharyngitis. Rx given for amoxicillin 500 mg 3 times a day and viscous lidocaine. Reassurance provided Rx over-the-counter for Tylenol as needed. Patient follow-up with PCP or return to ER with any worsening symptomology.      ____________________________________________   FINAL CLINICAL IMPRESSION(S) / ED DIAGNOSES  Final diagnoses:  Pharyngitis, unspecified etiology      NEW MEDICATIONS STARTED DURING THIS VISIT:  New Prescriptions   AMOXICILLIN (AMOXIL) 500 MG TABLET    Take 1 tablet (500 mg total) by mouth 3 (three) times daily.   LIDOCAINE (XYLOCAINE) 2 % SOLUTION    Use as directed 20 mLs in the mouth or throat as needed for mouth pain.     Note:  This document was prepared using Systems analyst and  may include unintentional dictation errors.   Arlyss Repress, PA-C 08/18/16 1223    Rudene Re, MD 08/20/16 Lurline Hare

## 2016-12-12 ENCOUNTER — Emergency Department: Payer: Medicare PPO

## 2016-12-12 ENCOUNTER — Encounter: Payer: Self-pay | Admitting: *Deleted

## 2016-12-12 DIAGNOSIS — E876 Hypokalemia: Secondary | ICD-10-CM | POA: Diagnosis not present

## 2016-12-12 DIAGNOSIS — I10 Essential (primary) hypertension: Secondary | ICD-10-CM | POA: Insufficient documentation

## 2016-12-12 DIAGNOSIS — Z7982 Long term (current) use of aspirin: Secondary | ICD-10-CM | POA: Insufficient documentation

## 2016-12-12 DIAGNOSIS — Z79899 Other long term (current) drug therapy: Secondary | ICD-10-CM | POA: Insufficient documentation

## 2016-12-12 DIAGNOSIS — R202 Paresthesia of skin: Secondary | ICD-10-CM | POA: Diagnosis not present

## 2016-12-12 DIAGNOSIS — R200 Anesthesia of skin: Secondary | ICD-10-CM | POA: Diagnosis present

## 2016-12-12 DIAGNOSIS — J449 Chronic obstructive pulmonary disease, unspecified: Secondary | ICD-10-CM | POA: Diagnosis not present

## 2016-12-12 DIAGNOSIS — F17210 Nicotine dependence, cigarettes, uncomplicated: Secondary | ICD-10-CM | POA: Insufficient documentation

## 2016-12-12 LAB — BASIC METABOLIC PANEL
ANION GAP: 10 (ref 5–15)
BUN: 12 mg/dL (ref 6–20)
CHLORIDE: 106 mmol/L (ref 101–111)
CO2: 25 mmol/L (ref 22–32)
Calcium: 7.5 mg/dL — ABNORMAL LOW (ref 8.9–10.3)
Creatinine, Ser: 1.17 mg/dL — ABNORMAL HIGH (ref 0.44–1.00)
GFR calc Af Amer: 57 mL/min — ABNORMAL LOW (ref >60)
GFR, EST NON AFRICAN AMERICAN: 50 mL/min — AB (ref >60)
Glucose, Bld: 117 mg/dL — ABNORMAL HIGH (ref 65–99)
POTASSIUM: 3.2 mmol/L — AB (ref 3.5–5.1)
SODIUM: 141 mmol/L (ref 135–145)

## 2016-12-12 LAB — CBC
HEMATOCRIT: 36.8 % (ref 35.0–47.0)
Hemoglobin: 12.6 g/dL (ref 12.0–16.0)
MCH: 30 pg (ref 26.0–34.0)
MCHC: 34.2 g/dL (ref 32.0–36.0)
MCV: 87.7 fL (ref 80.0–100.0)
Platelets: 242 10*3/uL (ref 150–440)
RBC: 4.19 MIL/uL (ref 3.80–5.20)
RDW: 14.4 % (ref 11.5–14.5)
WBC: 12.7 10*3/uL — AB (ref 3.6–11.0)

## 2016-12-12 LAB — TROPONIN I: Troponin I: <0.03 ng/mL (ref <0.03)

## 2016-12-12 NOTE — ED Triage Notes (Signed)
Pt reports having numbness/tingling to bilateral arms, legs, head, chest, pt was at [facility] today for a Potassium infusion, pt complains of chest pain

## 2016-12-13 ENCOUNTER — Emergency Department
Admission: EM | Admit: 2016-12-13 | Discharge: 2016-12-13 | Disposition: A | Discharge status: Home / Self Care | Payer: Medicare PPO | Attending: Emergency Medicine | Admitting: Emergency Medicine

## 2016-12-13 DIAGNOSIS — R202 Paresthesia of skin: Secondary | ICD-10-CM

## 2016-12-13 DIAGNOSIS — E876 Hypokalemia: Secondary | ICD-10-CM

## 2016-12-13 LAB — MAGNESIUM: MAGNESIUM: 0.7 mg/dL — AB (ref 1.7–2.4)

## 2016-12-13 LAB — ALBUMIN: ALBUMIN: 3.4 g/dL — AB (ref 3.5–5.0)

## 2016-12-13 MED ORDER — CALCIUM CARBONATE ANTACID 500 MG PO CHEW
2.0000 | CHEWABLE_TABLET | Freq: Once | ORAL | Status: AC
  Administered 2016-12-13: 400 mg via ORAL
  Filled 2016-12-13: qty 2

## 2016-12-13 MED ORDER — POTASSIUM CHLORIDE 20 MEQ PO PACK
40.0000 meq | PACK | ORAL | Status: AC
  Administered 2016-12-13: 40 meq via ORAL
  Filled 2016-12-13: qty 2

## 2016-12-13 MED ORDER — MAGNESIUM SULFATE 4 GM/100ML IV SOLN
4.0000 g | Freq: Once | INTRAVENOUS | Status: AC
  Administered 2016-12-13: 4 g via INTRAVENOUS
  Filled 2016-12-13: qty 100

## 2016-12-13 MED ORDER — SODIUM CHLORIDE 0.9 % IV SOLN
1.0000 g | Freq: Once | INTRAVENOUS | Status: AC
  Administered 2016-12-13: 1 g via INTRAVENOUS
  Filled 2016-12-13: qty 10

## 2016-12-13 NOTE — ED Notes (Addendum)
Dr Karma Greaser returned to ED from [facility] on the inpatient unit and made aware of pt's critical magnesium level as reported by lab (0.7mg /dL) at this time.

## 2016-12-13 NOTE — ED Notes (Signed)
Pt reports numbness bilaterally from elbow to hands; pt with chronic h/x of hypokalemia and received a K+ infusion yesterday at [facility]. Pt denies any c/o vision changes, slurred speech, facial droop, or unilateral extremity weakness or numbness.

## 2016-12-13 NOTE — ED Provider Notes (Signed)
[facility] Emergency Department Provider Note  ____________________________________________   First MD Initiated Contact with Patient 12/13/16 0125     (approximate)  I have reviewed the triage vital signs and the nursing notes.   HISTORY  Chief Complaint Numbness    HPI Jill Harrison is a 61 y.o. female with medical history as listed below and is particularly notable for persistent issues with multiple electrolytes including potassium, magnesium, and calcium.  She presents for evaluation of acute onset tingling around her mouth and face as well as mostly on both of her arms.  To a lesser degree she had some tingling in her legs and chest and had a very brief episode of reproducible pain in the left side of her chest but she feels that this was unrelated.  She was at the [facility] infusion center earlier today for potassium replenishment.  After the completion of her infusion she began to feel the tingling but at that point the providers had left for the day so she came here for evaluation.  She states the symptoms were severe at first but it got significantly better and she feels only mild tingling now in the tops of her arms.  The perioral and facial symptoms have completely resolved.  At no point did she have any muscle weakness or focal neurological deficits.  She has not felt any muscle spasmsand has not had any "claw hands".  She denies fever/chills, shortness of breath, abdominal pain, nausea, vomiting.  She is not having any difficulty with ambulation, has had no confusion, and denies headaches.  She already takes oral supplements for magnesium and potassium and received an extended infusion of potassium today but her magnesium was not replenished.  She has a history of breast cancer but she has been cancer free for 13 years and is not actively undergoing treatment.  The chest pain she describes was brief and sharp.  She states that she had a fall several  days ago and has been sore since that time.  She has been evaluated multiple times in the emergency department previously for chest pain but has no known cardiac history.   Past Medical History:  Diagnosis Date  . Anxiety   . Cancer [facility])    left breast cancer  . COPD (chronic obstructive pulmonary disease) ([place])   . Depression   . Hypertension   . Hypokalemia     There are no active problems to display for this patient.   Past Surgical History:  Procedure Laterality Date  . ABDOMINAL HYSTERECTOMY    . BLADDER SURGERY    . BREAST LUMPECTOMY    . CRANIOTOMY      Prior to Admission medications   Medication Sig Start Date End Date Taking? Authorizing Provider  ADVAIR DISKUS 500-50 MCG/DOSE AEPB Inhale 1 puff into the lungs 2 (two) times daily.    [provider]  ALPRAZolam Duanne Moron) 0.5 MG tablet Take 0.5 mg by mouth 2 (two) times daily as needed for anxiety.    [provider]  amoxicillin (AMOXIL) 500 MG tablet Take 1 tablet (500 mg total) by mouth 3 (three) times daily. 08/18/16   Rudene Re, MD  aspirin EC 81 MG tablet Take 81 mg by mouth daily.    [provider]  atorvastatin (LIPITOR) 20 MG tablet Take 20 mg by mouth every evening.    [provider]  benazepril (LOTENSIN) 40 MG tablet Take 40 mg by mouth daily.    [provider]  etodolac (LODINE) 200 MG capsule Take 1 capsule (200 mg total) by mouth every 8 (eight) hours. 05/30/16   Loney Hering, MD  FLUoxetine (PROZAC) 40 MG capsule Take 40 mg by mouth daily.    [provider]  hydrochlorothiazide (HYDRODIURIL) 12.5 MG tablet Take 12.5 mg by mouth daily.    [provider]  letrozole (FEMARA) 2.5 MG tablet Take 2.5 mg by mouth daily.    [provider]  levalbuterol Penne Lash HFA) 45 MCG/ACT inhaler Inhale 2 puffs into the lungs every 4 (four) hours as needed for wheezing.    [provider]  lidocaine (XYLOCAINE) 2 % solution Use  as directed 20 mLs in the mouth or throat as needed for mouth pain. 08/18/16   Rudene Re, MD  pantoprazole (PROTONIX) 40 MG tablet Take 40 mg by mouth 2 (two) times daily.     [provider]  potassium chloride SA (K-DUR,KLOR-CON) 20 MEQ tablet Take 40 mEq by mouth daily.    [provider]  predniSONE (DELTASONE) 10 MG tablet Take 10 mg by mouth as directed. 3 tabs 2x daily days 1, 2, 3; 2 tabs 2x daily days 4, 5, 6; 1 tab 2x daily days 7, 8, 9; half tab 2x daily days 10, 11 and 12 05/17/16   [provider]  predniSONE (DELTASONE) 20 MG tablet Take 3 tablets (60 mg total) by mouth daily. 05/30/16   Loney Hering, MD  spironolactone (ALDACTONE) 25 MG tablet Take 25 mg by mouth daily.    [provider]  tiotropium (SPIRIVA) 18 MCG inhalation capsule Place 18 mcg into inhaler and inhale daily.    [provider]    Allergies Albuterol and Avelox [moxifloxacin hcl in nacl]  No family history on file.  Social History Social History  Substance Use Topics  . Smoking status: Current Every Day Smoker    Packs/day: 0.50    Types: Cigarettes  . Smokeless tobacco: Never Used  . Alcohol use No    Review of Systems Constitutional: No fever/chills Eyes: No visual changes. ENT: No sore throat. Cardiovascular: Denies chest pain. Respiratory: Denies shortness of breath. Gastrointestinal: No abdominal pain.  No nausea, no vomiting.  No diarrhea.  No constipation. Genitourinary: Negative for dysuria. Musculoskeletal: Negative for neck pain.  Negative for back pain. Integumentary: Negative for rash. Neurological: Negative for headaches, focal weakness or numbness.   ____________________________________________   PHYSICAL EXAM:  VITAL SIGNS: ED Triage Vitals [12/12/16 2302]  Enc Vitals Group     BP 120/80     Pulse Rate 86     Resp 18     Temp 98.2 F (36.8 C)     Temp Source Oral     SpO2 100 %     Weight 85.7 kg (189 lb)      Height 1.676 m (5\' 6" )     Head Circumference      Peak Flow      Pain Score 5     Pain Loc      Pain Edu?      Excl. in [place]?     Constitutional: Alert and oriented. Well appearing and in no acute distress. Eyes: Conjunctivae are normal.  Head: Atraumatic. Nose: No congestion/rhinnorhea. Mouth/Throat: Mucous membranes are moist. Neck: No stridor.  No meningeal signs.   Cardiovascular: Normal rate, regular rhythm. Good peripheral circulation. Grossly normal heart sounds. Highly reproducible left-sided chest wall tenderness that to the patient feels most consistent with a  slight muscle strain. Respiratory: Normal respiratory effort.  No retractions. Lungs CTAB. Gastrointestinal: Obese.  Soft and nontender. No distention.  Musculoskeletal: No lower extremity tenderness nor edema. No gross deformities of extremities. Neurologic:  Normal speech and language. No gross focal neurologic deficits are appreciated.  Good muscle strength throughout.  No significant sensory deficits at this time.  No tetany, negative chvostek sign. Skin:  Skin is warm, dry and intact. No rash noted. Psychiatric: Mood and affect are normal. Speech and behavior are normal.  ____________________________________________   LABS (all labs ordered are listed, but only abnormal results are displayed)  Labs Reviewed  BASIC METABOLIC PANEL - Abnormal; Notable for the following:       Result Value   Potassium 3.2 (*)    Glucose, Bld 117 (*)    Creatinine, Ser 1.17 (*)    Calcium 7.5 (*)    GFR calc non Af Amer 50 (*)    GFR calc Af Amer 57 (*)    All other components within normal limits  CBC - Abnormal; Notable for the following:    WBC 12.7 (*)    All other components within normal limits  MAGNESIUM - Abnormal; Notable for the following:    Magnesium 0.7 (*)    All other components within normal limits  ALBUMIN - Abnormal; Notable for the following:    Albumin 3.4 (*)    All other components within normal  limits  TROPONIN I  CALCIUM, IONIZED   ____________________________________________  EKG  ED ECG REPORT I, Kenshin Splawn, the attending physician, personally viewed and interpreted this ECG.  Date: 12/12/2016 EKG Time: 23:08 Rate: 85 Rhythm: normal sinus rhythm QRS Axis: normal Intervals: normal ST/T Wave abnormalities: Non-specific ST segment / T-wave changes, but no evidence of acute ischemia. Narrative Interpretation: no evidence of acute ischemia   ____________________________________________  RADIOLOGY   Dg Chest 2 View  Result Date: 12/12/2016 CLINICAL DATA:  Initial evaluation for acute chest pain. EXAM: CHEST  2 VIEW COMPARISON:  Prior radiograph from 06/10/2016. FINDINGS: The cardiac and mediastinal silhouettes are stable in size and contour, and remain within normal limits. Aortic atherosclerosis. The lungs are normally inflated. Changes related COPD noted. No airspace consolidation, pleural effusion, or pulmonary edema is identified. There is no pneumothorax. No acute osseous abnormality identified. IMPRESSION: 1. No active cardiopulmonary disease. 2. COPD. 3. Aortic atherosclerosis. Electronically Signed   By: Jeannine Boga M.D.   On: 12/12/2016 23:34    ____________________________________________   PROCEDURES  Critical Care performed: Yes, see critical care procedure note(s)   Procedure(s) performed:   .Critical Care Performed by: Hinda Kehr Authorized by: Hinda Kehr   Critical care provider statement:    Critical care time (minutes):  30   Critical care time was exclusive of:  Separately billable procedures and treating other patients   Critical care was necessary to treat or prevent imminent or life-threatening deterioration of the following conditions:  Metabolic crisis   Critical care was time spent personally by me on the following activities:  Development of treatment plan with patient or surrogate, discussions with consultants,  evaluation of patient's response to treatment, examination of patient, obtaining history from patient or surrogate, ordering and performing treatments and interventions, ordering and review of laboratory studies, ordering and review of radiographic studies, pulse oximetry, re-evaluation of patient's condition and review of old charts      ____________________________________________   INITIAL IMPRESSION / [place] / ED COURSE  Pertinent labs & imaging results that  were available during my care of the patient were reviewed by me and considered in my medical decision making (see chart for details).  the patient is stable and her symptoms have improved.  Her perioral paresthesia as well as paresthesia in her arms are consistent with her hypocalcemia.  I have added on an ionized calcium and albumin as well as a magnesium level to more broadly evaluate her electrolyte status.  Anticipate giving her some replacement calcium by IV as well as an oral supplement since the IV calcium is relatively short acting.  I suspect she will require supplementation for magnesium as well.  Regardless, she should not require inpatient treatment and if we can improve her electrolytes in the emergency Department she should be appropriate for outpatient follow-up with her regular providers.  She is comfortable with this plan.   Clinical Course as of Dec 13 553  Fri Dec 13, 2016  0331 Patient's labs are notable for a slightly low albumin, ionized calcium is still pending, right knee exam is critically low at 0.7.  I am replete in the magnesium with 4 g of IV magnesium infused over 2 hours.  I will also give calcium gluconate 1 g IV administered over one hour.  Given that her potassium is still low and that her magnesium is so low that she may not be absorbing the potassium from her infusion earlier today, I given her another 40 mEq by mouth of potassium.  Once she is stabilized with these electrolyte repletion  she can be discharged for outpatient follow-up.  She continues to be in no distress at this time  [CF]  0549 The patient has finished her magnesium and calcium infusions and has been sleeping comfortably.  I reassessed her and she states she feels much better with no residual tingling at all.  I encouraged her to call later today to schedule a follow-up appointment with her doctor.  The ionized calcium result is not back; apparently this is not a result we can get back in an "ED" time interval, but at least it will be available in the system.  I will prepare her discharge instructions and indicate how much of each supplement she received tonight.  I will also give her a dose of oral calcium which may last longer than the calcium gluconate administered by IV.  [CF]    Clinical Course User Index [CF] Hinda Kehr, MD    ____________________________________________  FINAL CLINICAL IMPRESSION(S) / ED DIAGNOSES  Final diagnoses:  Paresthesia  Hypocalcemia  Hypomagnesemia  Hypokalemia     MEDICATIONS GIVEN DURING THIS VISIT:  Medications  calcium carbonate (TUMS - dosed in mg elemental calcium) chewable tablet 400 mg of elemental calcium (not administered)  magnesium sulfate IVPB 4 g 100 mL (0 g Intravenous Stopped 12/13/16 0541)  potassium chloride (KLOR-CON) packet 40 mEq (40 mEq Oral Given 12/13/16 0314)  calcium gluconate 1 g in sodium chloride 0.9 % 100 mL IVPB (0 g Intravenous Stopped 12/13/16 0402)     NEW OUTPATIENT MEDICATIONS STARTED DURING THIS VISIT:  New Prescriptions   No medications on file    Modified Medications   No medications on file    Discontinued Medications   No medications on file     Note:  This document was prepared using Dragon voice recognition software and may include unintentional dictation errors.    Hinda Kehr, MD 12/13/16 [phone]

## 2016-12-13 NOTE — Discharge Instructions (Signed)
As we discussed, we believe that the numbness you are experiencing as a result of your electrolyte abnormalities.  In addition to your potassium still being a bit low in spite of your recent infusion, your total calcium level was low at 10.5 and your magnesium level was severely low at 0.7.  You can let your doctor noted that a lab called the ionized calcium is still pending but should be available for follow-up information.  Your albumin level was slightly low to.  We replenished her electrolytes with potassium 40 mEq by mouth, magnesium 4 g IV, and calcium gluconate 1 g IV in addition to 2 calcium carbonate tablets by mouth.  We do not have the ability to check vitamin D level in a timely fashion, but we recommend you discuss this with your regular doctor to determine if he would benefit from both calcium and vitamin D supplements.  At this time, we do recommend that you look for over-the-counter combined calcium and vitamin D supplements and begin taking them according to label instructions.  Continue to take your usual medications and supplements as well.  Follow up with your doctor at the next available opportunity to discuss your visit and any additional testing or treatment that he/she recommends.  Return to the emergency department if you develop new or worsening symptoms that concern you.

## 2016-12-19 LAB — CALCIUM, IONIZED: CALCIUM, IONIZED, SERUM: 4 mg/dL — AB (ref 4.5–5.6)

## 2019-10-24 ENCOUNTER — Emergency Department: Payer: Medicare HMO

## 2019-10-24 ENCOUNTER — Emergency Department
Admission: EM | Admit: 2019-10-24 | Discharge: 2019-10-24 | Disposition: A | Discharge status: Home / Self Care | Payer: Medicare HMO | Attending: Emergency Medicine | Admitting: Emergency Medicine

## 2019-10-24 ENCOUNTER — Other Ambulatory Visit: Payer: Self-pay

## 2019-10-24 DIAGNOSIS — I10 Essential (primary) hypertension: Secondary | ICD-10-CM | POA: Insufficient documentation

## 2019-10-24 DIAGNOSIS — J449 Chronic obstructive pulmonary disease, unspecified: Secondary | ICD-10-CM | POA: Diagnosis not present

## 2019-10-24 DIAGNOSIS — Z853 Personal history of malignant neoplasm of breast: Secondary | ICD-10-CM | POA: Diagnosis not present

## 2019-10-24 DIAGNOSIS — Z79899 Other long term (current) drug therapy: Secondary | ICD-10-CM | POA: Insufficient documentation

## 2019-10-24 DIAGNOSIS — F17210 Nicotine dependence, cigarettes, uncomplicated: Secondary | ICD-10-CM | POA: Diagnosis not present

## 2019-10-24 DIAGNOSIS — R0789 Other chest pain: Secondary | ICD-10-CM | POA: Diagnosis present

## 2019-10-24 LAB — CBC
HCT: 36.5 % (ref 36.0–46.0)
Hemoglobin: 12.7 g/dL (ref 12.0–15.0)
MCH: 31.4 pg (ref 26.0–34.0)
MCHC: 34.8 g/dL (ref 30.0–36.0)
MCV: 90.1 fL (ref 80.0–100.0)
Platelets: 225 10*3/uL (ref 150–400)
RBC: 4.05 MIL/uL (ref 3.87–5.11)
RDW: 13.4 % (ref 11.5–15.5)
WBC: 6.5 10*3/uL (ref 4.0–10.5)
nRBC: 0 % (ref 0.0–0.2)

## 2019-10-24 LAB — BASIC METABOLIC PANEL
Anion gap: 8 (ref 5–15)
BUN: 8 mg/dL (ref 8–23)
CO2: 24 mmol/L (ref 22–32)
Calcium: 8.6 mg/dL — ABNORMAL LOW (ref 8.9–10.3)
Chloride: 109 mmol/L (ref 98–111)
Creatinine, Ser: 1.02 mg/dL — ABNORMAL HIGH (ref 0.44–1.00)
GFR calc Af Amer: >60 mL/min (ref >60)
GFR calc non Af Amer: 58 mL/min — ABNORMAL LOW (ref >60)
Glucose, Bld: 90 mg/dL (ref 70–99)
Potassium: 3.8 mmol/L (ref 3.5–5.1)
Sodium: 141 mmol/L (ref 135–145)

## 2019-10-24 LAB — TROPONIN I (HIGH SENSITIVITY)
Troponin I (High Sensitivity): 5 ng/L (ref <18)
Troponin I (High Sensitivity): 6 ng/L (ref <18)

## 2019-10-24 MED ORDER — KETOROLAC TROMETHAMINE 30 MG/ML IJ SOLN
30.0000 mg | Freq: Once | INTRAMUSCULAR | Status: AC
  Administered 2019-10-24: 30 mg via INTRAMUSCULAR
  Filled 2019-10-24: qty 1

## 2019-10-24 MED ORDER — HYDROCODONE-ACETAMINOPHEN 5-325 MG PO TABS
1.0000 | ORAL_TABLET | Freq: Once | ORAL | Status: AC
  Administered 2019-10-24: 1 via ORAL
  Filled 2019-10-24: qty 1

## 2019-10-24 MED ORDER — TRAMADOL HCL 50 MG PO TABS: 50.0000 mg | ORAL_TABLET | Freq: Four times a day (QID) | ORAL | 0 refills | Status: AC | PRN

## 2019-10-24 NOTE — ED Provider Notes (Signed)
[facility] Emergency Department Provider Note   ____________________________________________    I have reviewed the triage vital signs and the nursing notes.   HISTORY  Chief Complaint Chest Pain     HPI Jill Harrison is a 64 y.o. female with a history of cancer, COPD who presents with complaints of chest wall pain.  Patient reports on May 18 she fell forward and landed on her chest.  Since then she has had an x-ray and a CT scan of her chest which has not demonstrated any fractures.  She continues to have tenderness in her sternum.  She has been taking ibuprofen with significant improvement however the last couple of days it does not seem to help.  Denies shortness of breath or pleurisy.  Has seen her oncologist recently.  No fevers or chills.  No diaphoresis.  No radiation of pain  Past Medical History:  Diagnosis Date  . Anxiety   . Cancer [facility])    left breast cancer  . COPD (chronic obstructive pulmonary disease) ([place])   . Depression   . Hypertension   . Hypokalemia     There are no problems to display for this patient.   Past Surgical History:  Procedure Laterality Date  . ABDOMINAL HYSTERECTOMY    . BLADDER SURGERY    . BREAST LUMPECTOMY    . CRANIOTOMY      Prior to Admission medications   Medication Sig Start Date End Date Taking? Authorizing Provider  ADVAIR DISKUS 500-50 MCG/DOSE AEPB Inhale 1 puff into the lungs 2 (two) times daily.    [provider]  ALPRAZolam Duanne Moron) 0.5 MG tablet Take 0.5 mg by mouth 2 (two) times daily as needed for anxiety.    [provider]  amoxicillin (AMOXIL) 500 MG tablet Take 1 tablet (500 mg total) by mouth 3 (three) times daily. 08/18/16   Rudene Re, MD  aspirin EC 81 MG tablet Take 81 mg by mouth daily.    [provider]  atorvastatin (LIPITOR) 20 MG tablet Take 20 mg by mouth every evening.    [provider]  benazepril (LOTENSIN) 40 MG tablet  Take 40 mg by mouth daily.    [provider]  etodolac (LODINE) 200 MG capsule Take 1 capsule (200 mg total) by mouth every 8 (eight) hours. 05/30/16   Loney Hering, MD  FLUoxetine (PROZAC) 40 MG capsule Take 40 mg by mouth daily.    [provider]  hydrochlorothiazide (HYDRODIURIL) 12.5 MG tablet Take 12.5 mg by mouth daily.    [provider]  letrozole (FEMARA) 2.5 MG tablet Take 2.5 mg by mouth daily.    [provider]  levalbuterol Penne Lash HFA) 45 MCG/ACT inhaler Inhale 2 puffs into the lungs every 4 (four) hours as needed for wheezing.    [provider]  lidocaine (XYLOCAINE) 2 % solution Use as directed 20 mLs in the mouth or throat as needed for mouth pain. 08/18/16   Rudene Re, MD  pantoprazole (PROTONIX) 40 MG tablet Take 40 mg by mouth 2 (two) times daily.     [provider]  potassium chloride SA (K-DUR,KLOR-CON) 20 MEQ tablet Take 40 mEq by mouth daily.    [provider]  predniSONE (DELTASONE) 10 MG tablet Take 10 mg by mouth as directed. 3 tabs 2x daily days 1, 2, 3; 2 tabs 2x daily days 4, 5, 6; 1 tab 2x daily days 7, 8, 9; half tab 2x daily  days 10, 11 and 12 05/17/16   [provider]  predniSONE (DELTASONE) 20 MG tablet Take 3 tablets (60 mg total) by mouth daily. 05/30/16   Loney Hering, MD  spironolactone (ALDACTONE) 25 MG tablet Take 25 mg by mouth daily.    [provider]  tiotropium (SPIRIVA) 18 MCG inhalation capsule Place 18 mcg into inhaler and inhale daily.    [provider]  traMADol (ULTRAM) 50 MG tablet Take 1 tablet (50 mg total) by mouth every 6 (six) hours as needed. 10/24/19 10/23/20  Lavonia Drafts, MD     Allergies Albuterol and Avelox [moxifloxacin hcl in nacl]  History reviewed. No pertinent family history.  Social History Social History   Tobacco Use  . Smoking status: Current Every Day Smoker    Packs/day: 0.50    Types: Cigarettes  .  Smokeless tobacco: Never Used  Substance Use Topics  . Alcohol use: No  . Drug use: No    Review of Systems  Constitutional: No fever/chills Eyes: No visual changes.  ENT: No sore throat. Cardiovascular: As above Respiratory: Denies shortness of breath. Gastrointestinal: No abdominal pain.  Genitourinary: Negative for dysuria. Musculoskeletal: Negative for back pain. Skin: Negative for rash. Neurological: Negative for headaches   ____________________________________________   PHYSICAL EXAM:  VITAL SIGNS: ED Triage Vitals  Enc Vitals Group     BP 10/24/19 1635 127/72     Pulse Rate 10/24/19 1635 70     Resp 10/24/19 1635 18     Temp 10/24/19 1635 97.7 F (36.5 C)     Temp Source 10/24/19 1635 Oral     SpO2 10/24/19 1635 97 %     Weight 10/24/19 1636 85.7 kg (189 lb)     Height 10/24/19 1636 1.702 m (5\' 7" )     Head Circumference --      Peak Flow --      Pain Score 10/24/19 1636 8     Pain Loc --      Pain Edu? --      Excl. in [place]? --     Constitutional: Alert and oriented.   Nose: No congestion/rhinnorhea. Mouth/Throat: Mucous membranes are moist.    Cardiovascular: Normal rate, regular rhythm. Grossly normal heart sounds.  Good peripheral circulation.  Mild tenderness to palpation of the sternum Respiratory: Normal respiratory effort.  No retractions. Gastrointestinal: Soft and nontender. No distention.    Musculoskeletal:   Warm and well perfused Neurologic:  Normal speech and language. No gross focal neurologic deficits are appreciated.  Skin:  Skin is warm, dry and intact. No rash noted. Psychiatric: Mood and affect are normal. Speech and behavior are normal.  ____________________________________________   LABS (all labs ordered are listed, but only abnormal results are displayed)  Labs Reviewed  BASIC METABOLIC PANEL - Abnormal; Notable for the following components:      Result Value   Creatinine, Ser 1.02 (*)    Calcium 8.6 (*)    GFR calc  non Af Amer 58 (*)    All other components within normal limits  CBC  TROPONIN I (HIGH SENSITIVITY)  TROPONIN I (HIGH SENSITIVITY)   ____________________________________________  EKG  ED ECG REPORT I, Lavonia Drafts, the attending physician, personally viewed and interpreted this ECG.  Date: 10/24/2019  Rhythm: normal sinus rhythm QRS Axis: normal Intervals: normal ST/T Wave abnormalities: Nonspecific change Narrative Interpretation: no evidence of acute ischemia  ____________________________________________  RADIOLOGY  Chest x-ray reviewed by me, no evidence of bony injury ____________________________________________  PROCEDURES  Procedure(s) performed: No  Procedures   Critical Care performed: No ____________________________________________   INITIAL IMPRESSION / ASSESSMENT AND PLAN / ED COURSE  Pertinent labs & imaging results that were available during my care of the patient were reviewed by me and considered in my medical decision making (see chart for details).  Patient here for continued chest wall pain from sternal injury nearly 2 weeks ago.  Recommend continue taking ibuprofen, will add tramadol as needed.  Work-up here is quite reassuring, troponin is negative x2, EKG reassuring, chest x-ray unremarkable.  Intramuscular Toradol given in ED.    ____________________________________________   FINAL CLINICAL IMPRESSION(S) / ED DIAGNOSES  Final diagnoses:  Chest wall pain        Note:  This document was prepared using Dragon voice recognition software and may include unintentional dictation errors.   Lavonia Drafts, MD 10/25/19 0000

## 2019-10-24 NOTE — ED Notes (Signed)
Pt with restrictions to Left Arm per breast cancer

## 2019-10-24 NOTE — ED Triage Notes (Signed)
Pt comes EMS from home with chronic chest pain from fall may 18th. Pt fell onto her chest and states the pain has gotten worse. Pt has had many scans and xrays since the fall with no reason for the pain. Pt also sees cancer MD for her breast cancer and had recent bone density scan for pain.

## 2019-10-24 NOTE — ED Notes (Signed)
Pt reports increased pain with coughing and movement. Does not feel short of breath, no n/v  Pt reports worsening pain since fall May 18th, where she landed on her chest

## 2019-10-24 NOTE — ED Notes (Signed)
Pen pad not working, paper copy signed. Pt advised not to drive within 8 hrs narcotic admin

## 2019-10-26 ENCOUNTER — Other Ambulatory Visit: Payer: Self-pay

## 2019-10-26 ENCOUNTER — Encounter: Payer: Self-pay | Admitting: Emergency Medicine

## 2019-10-26 ENCOUNTER — Emergency Department: Payer: Medicare HMO

## 2019-10-26 ENCOUNTER — Emergency Department
Admission: EM | Admit: 2019-10-26 | Discharge: 2019-10-26 | Disposition: A | Discharge status: Home / Self Care | Payer: Medicare HMO | Attending: Emergency Medicine | Admitting: Emergency Medicine

## 2019-10-26 DIAGNOSIS — Y929 Unspecified place or not applicable: Secondary | ICD-10-CM | POA: Diagnosis not present

## 2019-10-26 DIAGNOSIS — R0789 Other chest pain: Secondary | ICD-10-CM | POA: Diagnosis not present

## 2019-10-26 DIAGNOSIS — R079 Chest pain, unspecified: Secondary | ICD-10-CM

## 2019-10-26 DIAGNOSIS — W19XXXA Unspecified fall, initial encounter: Secondary | ICD-10-CM | POA: Insufficient documentation

## 2019-10-26 DIAGNOSIS — S2249XA Multiple fractures of ribs, unspecified side, initial encounter for closed fracture: Secondary | ICD-10-CM

## 2019-10-26 DIAGNOSIS — Y999 Unspecified external cause status: Secondary | ICD-10-CM | POA: Diagnosis not present

## 2019-10-26 DIAGNOSIS — S299XXA Unspecified injury of thorax, initial encounter: Secondary | ICD-10-CM | POA: Diagnosis present

## 2019-10-26 DIAGNOSIS — S2221XA Fracture of manubrium, initial encounter for closed fracture: Secondary | ICD-10-CM

## 2019-10-26 DIAGNOSIS — Y9389 Activity, other specified: Secondary | ICD-10-CM | POA: Insufficient documentation

## 2019-10-26 DIAGNOSIS — I10 Essential (primary) hypertension: Secondary | ICD-10-CM | POA: Diagnosis not present

## 2019-10-26 DIAGNOSIS — Z79899 Other long term (current) drug therapy: Secondary | ICD-10-CM | POA: Diagnosis not present

## 2019-10-26 DIAGNOSIS — S2242XA Multiple fractures of ribs, left side, initial encounter for closed fracture: Secondary | ICD-10-CM | POA: Diagnosis not present

## 2019-10-26 DIAGNOSIS — F17210 Nicotine dependence, cigarettes, uncomplicated: Secondary | ICD-10-CM | POA: Insufficient documentation

## 2019-10-26 LAB — TROPONIN I (HIGH SENSITIVITY): Troponin I (High Sensitivity): 4 ng/L (ref <18)

## 2019-10-26 LAB — FIBRIN DERIVATIVES D-DIMER (ARMC ONLY): Fibrin derivatives D-dimer (ARMC): 1181.04 ng/mL (FEU) — ABNORMAL HIGH (ref 0.00–499.00)

## 2019-10-26 LAB — CBC
HCT: 36.5 % (ref 36.0–46.0)
Hemoglobin: 13.1 g/dL (ref 12.0–15.0)
MCH: 31.6 pg (ref 26.0–34.0)
MCHC: 35.9 g/dL (ref 30.0–36.0)
MCV: 88 fL (ref 80.0–100.0)
Platelets: 233 10*3/uL (ref 150–400)
RBC: 4.15 MIL/uL (ref 3.87–5.11)
RDW: 13.1 % (ref 11.5–15.5)
WBC: 8.7 10*3/uL (ref 4.0–10.5)
nRBC: 0 % (ref 0.0–0.2)

## 2019-10-26 LAB — BASIC METABOLIC PANEL
Anion gap: 11 (ref 5–15)
BUN: 9 mg/dL (ref 8–23)
CO2: 25 mmol/L (ref 22–32)
Calcium: 9 mg/dL (ref 8.9–10.3)
Chloride: 103 mmol/L (ref 98–111)
Creatinine, Ser: 0.92 mg/dL (ref 0.44–1.00)
GFR calc Af Amer: >60 mL/min (ref >60)
GFR calc non Af Amer: >60 mL/min (ref >60)
Glucose, Bld: 139 mg/dL — ABNORMAL HIGH (ref 70–99)
Potassium: 3.3 mmol/L — ABNORMAL LOW (ref 3.5–5.1)
Sodium: 139 mmol/L (ref 135–145)

## 2019-10-26 MED ORDER — IOHEXOL 350 MG/ML SOLN
75.0000 mL | Freq: Once | INTRAVENOUS | Status: AC | PRN
  Administered 2019-10-26: 75 mL via INTRAVENOUS
  Filled 2019-10-26: qty 75

## 2019-10-26 MED ORDER — OXYCODONE-ACETAMINOPHEN 5-325 MG PO TABS
2.0000 | ORAL_TABLET | Freq: Once | ORAL | Status: AC
  Administered 2019-10-26: 2 via ORAL
  Filled 2019-10-26: qty 2

## 2019-10-26 MED ORDER — OXYCODONE-ACETAMINOPHEN 5-325 MG PO TABS: 1.0000 | ORAL_TABLET | ORAL | 0 refills | Status: AC | PRN

## 2019-10-26 NOTE — ED Triage Notes (Signed)
Pt reports she fell 2 weeks ago and landed on her chest. Pt states was seen in the ED there and was told nothing was broken. Pt reports continues to have pain to her chest from mid to right side that are sharp in nature.

## 2019-10-26 NOTE — Discharge Instructions (Addendum)
He has been seen in the emergency department today for a fall 2 weeks ago.  Your CT scan shows you have suffered a sternum as well as multiple rib fractures.  Please take your pain medication as needed, as prescribed.  Please use your incentive spirometer several times per hour while awake for the next 7 to 10 days to help prevent pneumonia.  Return to the emergency department for any shortness of breath, fever, or any other symptom personally concerning to yourself.

## 2019-10-26 NOTE — ED Notes (Signed)
Says she fell last week onto anterior chest.  Was seen since for same.  Says no fractures, but says the pain meds are no longer working .  She is bracing mid sternal area with her hand.  Says she is unable to cough due to pain when she does. Says when she had coughed it is productive.  Says she has not had a fever at home.  Placed on monitor--NSR.

## 2019-10-26 NOTE — ED Provider Notes (Signed)
[facility] Emergency Department Provider Note  Time seen: 4:25 PM  I have reviewed the triage vital signs and the nursing notes.   HISTORY  Chief Complaint Chest Pain   HPI Jill Harrison is a 63 y.o. female with a past medical history of COPD, depression, hypertension, left-sided breast cancer, presents to the emergency department for chest pain.  According to the patient 2 weeks ago she had a fall in which she landed mostly on her chest per patient.  Ever since that time she has been experiencing chest pain.  Patient was seen in the emergency department 2 days ago for the same, had a negative work-up but states since going home she has continued to have pain in fact she feels like it is worse than it was 2 days ago.  Denies any worsening of pain with deep inspiration.  Denies any other pain in her body.  Denies any cough.  States the pain is much worse if she pushes on the area.   Past Medical History:  Diagnosis Date  . Anxiety   . Cancer [facility])    left breast cancer  . COPD (chronic obstructive pulmonary disease) ([place])   . Depression   . Hypertension   . Hypokalemia     There are no problems to display for this patient.   Past Surgical History:  Procedure Laterality Date  . ABDOMINAL HYSTERECTOMY    . BLADDER SURGERY    . BREAST LUMPECTOMY    . CRANIOTOMY      Prior to Admission medications   Medication Sig Start Date End Date Taking? Authorizing Provider  ALPRAZolam Duanne Moron) 0.5 MG tablet Take 0.5 mg by mouth 2 (two) times daily as needed for anxiety.   Yes [provider]  aspirin EC 81 MG tablet Take 81 mg by mouth daily.   Yes [provider]  atorvastatin (LIPITOR) 20 MG tablet Take 20 mg by mouth every evening.   Yes [provider]  benazepril (LOTENSIN) 40 MG tablet Take 40 mg by mouth daily.   Yes [provider]  FLUoxetine (PROZAC) 40 MG capsule Take 40 mg by mouth daily.   Yes [provider]  levalbuterol (XOPENEX HFA) 45 MCG/ACT inhaler Inhale 2 puffs into the lungs every 4 (four) hours as needed for wheezing.   Yes [provider]  magnesium oxide (MAG-OX) 400 MG tablet Take 400 mg by mouth daily.   Yes [provider]  pantoprazole (PROTONIX) 40 MG tablet Take 40 mg by mouth 2 (two) times daily.    Yes [provider]  Phenazopyridine-Butabarb-Hyosc (TRELLIUM PLUS PO) Take by mouth.   Yes [provider]  potassium chloride SA (K-DUR,KLOR-CON) 20 MEQ tablet Take 40 mEq by mouth daily.   Yes [provider]  spironolactone (ALDACTONE) 25 MG tablet Take 25 mg by mouth daily.   Yes [provider]  traMADol (ULTRAM) 50 MG tablet Take 1 tablet (50 mg total) by mouth every 6 (six) hours as needed. 10/24/19 10/23/20 Yes Lavonia Drafts, MD  ADVAIR DISKUS 500-50 MCG/DOSE AEPB Inhale 1 puff into the lungs 2 (two) times daily.    [provider]  amoxicillin (AMOXIL) 500 MG tablet Take 1 tablet (500 mg total) by mouth 3 (three) times daily. 08/18/16   Rudene Re, MD  etodolac (LODINE) 200 MG capsule Take 1 capsule (200 mg total) by mouth every 8 (eight) hours. 05/30/16   Loney Hering, MD  hydrochlorothiazide (HYDRODIURIL) 12.5 MG  tablet Take 12.5 mg by mouth daily.    [provider]  letrozole (FEMARA) 2.5 MG tablet Take 2.5 mg by mouth daily.    [provider]  lidocaine (XYLOCAINE) 2 % solution Use as directed 20 mLs in the mouth or throat as needed for mouth pain. 08/18/16   Rudene Re, MD  predniSONE (DELTASONE) 10 MG tablet Take 10 mg by mouth as directed. 3 tabs 2x daily days 1, 2, 3; 2 tabs 2x daily days 4, 5, 6; 1 tab 2x daily days 7, 8, 9; half tab 2x daily days 10, 11 and 12 05/17/16   [provider]  predniSONE (DELTASONE) 20 MG tablet Take 3 tablets (60 mg total) by mouth daily. 05/30/16   Loney Hering, MD  tiotropium (SPIRIVA) 18 MCG inhalation capsule  Place 18 mcg into inhaler and inhale daily.    [provider]    Allergies  Allergen Reactions  . Albuterol     "They make me jittery, nervous."  . Avelox [Moxifloxacin Hcl In Nacl]     "They make me jittery, nervous."    No family history on file.  Social History Social History   Tobacco Use  . Smoking status: Current Every Day Smoker    Packs/day: 0.50    Types: Cigarettes  . Smokeless tobacco: Never Used  Substance Use Topics  . Alcohol use: No  . Drug use: No    Review of Systems Constitutional: Negative for fever. Cardiovascular: Positive for chest wall pain. Respiratory: Negative for shortness of breath. Gastrointestinal: Negative for abdominal pain, vomiting and diarrhea. Genitourinary: Negative for urinary compaints Musculoskeletal: Negative for musculoskeletal complaints Neurological: Negative for headache All other ROS negative  ____________________________________________   PHYSICAL EXAM:  VITAL SIGNS: ED Triage Vitals  Enc Vitals Group     BP 10/26/19 1405 128/79     Pulse Rate 10/26/19 1405 73     Resp 10/26/19 1405 20     Temp 10/26/19 1407 99.4 F (37.4 C)     Temp Source 10/26/19 1407 Oral     SpO2 10/26/19 1405 97 %     Weight 10/26/19 1405 189 lb (85.7 kg)     Height 10/26/19 1405 5\' 7"  (1.702 m)     Head Circumference --      Peak Flow --      Pain Score 10/26/19 1405 10     Pain Loc --      Pain Edu? --      Excl. in [place]? --    Constitutional: Alert and oriented. Well appearing and in no distress. Eyes: Normal exam ENT      Head: Normocephalic and atraumatic.      Mouth/Throat: Mucous membranes are moist. Cardiovascular: Normal rate, regular rhythm.  Respiratory: Normal respiratory effort without tachypnea nor retractions. Breath sounds are clear.  Chest wall is very tender to palpation especially overlying the sternum. Gastrointestinal: Soft and nontender. No distention.  Musculoskeletal: Nontender with normal range of  motion in all extremities.  Neurologic:  Normal speech and language. No gross focal neurologic deficits  Skin:  Skin is warm, dry and intact.  Psychiatric: Mood and affect are normal.   ____________________________________________   RADIOLOGY  Chest x-ray is negative    ____________________________________________   INITIAL IMPRESSION / ASSESSMENT AND PLAN / ED COURSE  Pertinent labs & imaging results that were available during my care of the patient were reviewed by me and considered in my medical decision making (see chart for  details).   Patient presents to the emergency department for chest pain ever since a fall 2 weeks ago, feels like it is getting worse and not better.  Patient does have significant tenderness overlying the sternum.  Patient's lab work shows a positive D-dimer.  Given the patient's continued chest pain with positive D-dimer we will obtain a CT angiography of the chest to rule out PE.  D-dimer could be positive due to fall/hematoma/contusions.  Patient agreeable to plan of care.  Reassuringly troponin is negative.  CTA of the chest is positive for a sternal fracture as well as rib fractures.  As this occurred 2 weeks ago I believe the patient would be safe for discharge home on Percocet for pain control with an incentive spirometer to help reduce her risk of pneumonia.  Patient agreeable to plan.  Jill Harrison was evaluated in Emergency Department on 10/26/2019 for the symptoms described in the history of present illness. She was evaluated in the context of the global COVID-19 pandemic, which necessitated consideration that the patient might be at risk for infection with the SARS-CoV-2 virus that causes COVID-19. Institutional protocols and algorithms that pertain to the evaluation of patients at risk for COVID-19 are in a state of rapid change based on information released by regulatory bodies including the CDC and federal and state organizations. These policies  and algorithms were followed during the patient's care in the ED.  ____________________________________________   FINAL CLINICAL IMPRESSION(S) / ED DIAGNOSES  Chest wall pain Sternal fracture Rib fractures   Harvest Dark, MD 10/26/19 1826

## 2020-02-21 ENCOUNTER — Other Ambulatory Visit: Payer: Self-pay

## 2020-02-21 DIAGNOSIS — Z5321 Procedure and treatment not carried out due to patient leaving prior to being seen by health care provider: Secondary | ICD-10-CM | POA: Diagnosis not present

## 2020-02-21 DIAGNOSIS — R202 Paresthesia of skin: Secondary | ICD-10-CM | POA: Insufficient documentation

## 2020-02-21 DIAGNOSIS — K921 Melena: Secondary | ICD-10-CM | POA: Diagnosis not present

## 2020-02-21 NOTE — ED Triage Notes (Signed)
Pt in with co left arm and left leg numbness since 1000 this am. States at 2100 she felt like numbness became worse. Pt took xanax for anxiety and did not relieve her symptoms. States called EMS when she took second xanax without relief. No facial numbness or slurred speech, states some numbness to right hand as well. Pt denies any hx of the same, denies any weakness.

## 2020-02-21 NOTE — ED Triage Notes (Signed)
EMS brought pt in from home for numbness to left arm and leg "all day"; 2 black stools today

## 2020-02-22 ENCOUNTER — Telehealth: Payer: Self-pay | Admitting: Emergency Medicine

## 2020-02-22 ENCOUNTER — Emergency Department: Payer: Medicare HMO

## 2020-02-22 ENCOUNTER — Emergency Department
Admission: EM | Admit: 2020-02-22 | Discharge: 2020-02-22 | Disposition: A | Discharge status: Home / Self Care | Payer: Medicare HMO | Attending: Emergency Medicine | Admitting: Emergency Medicine

## 2020-02-22 LAB — COMPREHENSIVE METABOLIC PANEL
ALT: 17 U/L (ref 0–44)
AST: 21 U/L (ref 15–41)
Albumin: 3.8 g/dL (ref 3.5–5.0)
Alkaline Phosphatase: 97 U/L (ref 38–126)
Anion gap: 9 (ref 5–15)
BUN: 8 mg/dL (ref 8–23)
CO2: 27 mmol/L (ref 22–32)
Calcium: 8.1 mg/dL — ABNORMAL LOW (ref 8.9–10.3)
Chloride: 105 mmol/L (ref 98–111)
Creatinine, Ser: 1.12 mg/dL — ABNORMAL HIGH (ref 0.44–1.00)
GFR calc Af Amer: >60 mL/min (ref >60)
GFR calc non Af Amer: 52 mL/min — ABNORMAL LOW (ref >60)
Glucose, Bld: 119 mg/dL — ABNORMAL HIGH (ref 70–99)
Potassium: 2.6 mmol/L — CL (ref 3.5–5.1)
Sodium: 141 mmol/L (ref 135–145)
Total Bilirubin: 0.6 mg/dL (ref 0.3–1.2)
Total Protein: 6.7 g/dL (ref 6.5–8.1)

## 2020-02-22 LAB — CBC
HCT: 35.3 % — ABNORMAL LOW (ref 36.0–46.0)
Hemoglobin: 12.3 g/dL (ref 12.0–15.0)
MCH: 31.1 pg (ref 26.0–34.0)
MCHC: 34.8 g/dL (ref 30.0–36.0)
MCV: 89.1 fL (ref 80.0–100.0)
Platelets: 198 10*3/uL (ref 150–400)
RBC: 3.96 MIL/uL (ref 3.87–5.11)
RDW: 13.5 % (ref 11.5–15.5)
WBC: 7.8 10*3/uL (ref 4.0–10.5)
nRBC: 0 % (ref 0.0–0.2)

## 2020-02-22 LAB — TROPONIN I (HIGH SENSITIVITY): Troponin I (High Sensitivity): 5 ng/L (ref <18)

## 2020-02-22 NOTE — Telephone Encounter (Signed)
Called patient due to lwot to inquire about condition and follow up plans. Left message.   

## 2020-06-23 ENCOUNTER — Other Ambulatory Visit: Payer: Self-pay | Admitting: Family Medicine

## 2020-06-23 ENCOUNTER — Other Ambulatory Visit: Payer: Self-pay

## 2020-06-23 ENCOUNTER — Ambulatory Visit
Admission: RE | Admit: 2020-06-23 | Discharge: 2020-06-23 | Disposition: A | Discharge status: Home / Self Care | Payer: Medicare HMO | Source: Ambulatory Visit | Attending: Family Medicine | Admitting: Family Medicine

## 2020-06-23 ENCOUNTER — Ambulatory Visit
Admission: RE | Admit: 2020-06-23 | Discharge: 2020-06-23 | Disposition: A | Discharge status: Home / Self Care | Payer: Medicare HMO | Attending: Family Medicine | Admitting: Family Medicine

## 2020-06-23 DIAGNOSIS — M810 Age-related osteoporosis without current pathological fracture: Secondary | ICD-10-CM | POA: Insufficient documentation

## 2023-09-02 ENCOUNTER — Other Ambulatory Visit: Payer: Self-pay | Admitting: Physician Assistant

## 2023-09-02 DIAGNOSIS — R42 Dizziness and giddiness: Secondary | ICD-10-CM

## 2023-09-02 DIAGNOSIS — Z86018 Personal history of other benign neoplasm: Secondary | ICD-10-CM

## 2023-09-04 ENCOUNTER — Other Ambulatory Visit

## 2023-09-08 ENCOUNTER — Ambulatory Visit
Admission: RE | Admit: 2023-09-08 | Discharge: 2023-09-08 | Disposition: A | Discharge status: Home / Self Care | Source: Ambulatory Visit | Attending: Physician Assistant | Admitting: Physician Assistant

## 2023-09-08 DIAGNOSIS — Z86018 Personal history of other benign neoplasm: Secondary | ICD-10-CM | POA: Insufficient documentation

## 2023-09-08 DIAGNOSIS — R42 Dizziness and giddiness: Secondary | ICD-10-CM | POA: Diagnosis present

## 2023-09-08 MED ORDER — GADOBUTROL 1 MMOL/ML IV SOLN
7.5000 mL | Freq: Once | INTRAVENOUS | Status: AC | PRN
  Administered 2023-09-08: 7.5 mL via INTRAVENOUS
